# Patient Record
Sex: Male | Born: 1940 | Race: White | Hispanic: No | Marital: Married | Smoking: Former smoker
Health system: Southern US, Community
[De-identification: ages and names within clinical notes are randomized; demographics above are authoritative.]

## PROBLEM LIST (undated history)

## (undated) DIAGNOSIS — Z8719 Personal history of other diseases of the digestive system: Secondary | ICD-10-CM

## (undated) DIAGNOSIS — Z87442 Personal history of urinary calculi: Secondary | ICD-10-CM

## (undated) DIAGNOSIS — I2510 Atherosclerotic heart disease of native coronary artery without angina pectoris: Secondary | ICD-10-CM

## (undated) DIAGNOSIS — C801 Malignant (primary) neoplasm, unspecified: Secondary | ICD-10-CM

## (undated) DIAGNOSIS — R0602 Shortness of breath: Secondary | ICD-10-CM

## (undated) DIAGNOSIS — K219 Gastro-esophageal reflux disease without esophagitis: Secondary | ICD-10-CM

## (undated) DIAGNOSIS — H6690 Otitis media, unspecified, unspecified ear: Secondary | ICD-10-CM

## (undated) DIAGNOSIS — E785 Hyperlipidemia, unspecified: Secondary | ICD-10-CM

## (undated) DIAGNOSIS — R9431 Abnormal electrocardiogram [ECG] [EKG]: Secondary | ICD-10-CM

## (undated) DIAGNOSIS — R600 Localized edema: Secondary | ICD-10-CM

## (undated) DIAGNOSIS — M1990 Unspecified osteoarthritis, unspecified site: Secondary | ICD-10-CM

## (undated) DIAGNOSIS — I10 Essential (primary) hypertension: Secondary | ICD-10-CM

## (undated) DIAGNOSIS — G629 Polyneuropathy, unspecified: Secondary | ICD-10-CM

## (undated) DIAGNOSIS — R7303 Prediabetes: Secondary | ICD-10-CM

## (undated) HISTORY — PX: TONSILLECTOMY: SUR1361

## (undated) HISTORY — PX: COLECTOMY: SHX59

## (undated) HISTORY — PX: PENILE PROSTHESIS IMPLANT: SHX240

## (undated) HISTORY — PX: LUMBAR SPINE SURGERY: SHX701

## (undated) HISTORY — PX: APPENDECTOMY: SHX54

---

## 1999-12-01 ENCOUNTER — Ambulatory Visit (HOSPITAL_COMMUNITY): Admission: RE | Admit: 1999-12-01 | Discharge: 1999-12-01 | Payer: Self-pay | Admitting: *Deleted

## 1999-12-01 ENCOUNTER — Encounter (INDEPENDENT_AMBULATORY_CARE_PROVIDER_SITE_OTHER): Payer: Self-pay | Admitting: *Deleted

## 1999-12-08 ENCOUNTER — Other Ambulatory Visit: Admission: RE | Admit: 1999-12-08 | Discharge: 1999-12-08 | Payer: Self-pay | Admitting: Urology

## 2000-01-16 ENCOUNTER — Inpatient Hospital Stay (HOSPITAL_COMMUNITY): Admission: RE | Admit: 2000-01-16 | Discharge: 2000-01-19 | Payer: Self-pay | Admitting: Urology

## 2000-01-16 ENCOUNTER — Encounter (INDEPENDENT_AMBULATORY_CARE_PROVIDER_SITE_OTHER): Payer: Self-pay

## 2000-09-21 ENCOUNTER — Encounter: Admission: RE | Admit: 2000-09-21 | Discharge: 2000-09-21 | Payer: Self-pay | Admitting: Internal Medicine

## 2000-09-21 ENCOUNTER — Encounter: Payer: Self-pay | Admitting: Internal Medicine

## 2001-09-25 HISTORY — PX: PROSTATECTOMY: SHX69

## 2001-10-11 ENCOUNTER — Encounter: Admission: RE | Admit: 2001-10-11 | Discharge: 2001-10-11 | Payer: Self-pay | Admitting: *Deleted

## 2001-11-27 ENCOUNTER — Ambulatory Visit (HOSPITAL_BASED_OUTPATIENT_CLINIC_OR_DEPARTMENT_OTHER): Admission: RE | Admit: 2001-11-27 | Discharge: 2001-11-27 | Payer: Self-pay | Admitting: Urology

## 2002-09-26 ENCOUNTER — Encounter: Payer: Self-pay | Admitting: Neurosurgery

## 2002-09-26 ENCOUNTER — Ambulatory Visit (HOSPITAL_COMMUNITY): Admission: RE | Admit: 2002-09-26 | Discharge: 2002-09-26 | Payer: Self-pay | Admitting: Neurosurgery

## 2004-03-17 ENCOUNTER — Encounter: Admission: RE | Admit: 2004-03-17 | Discharge: 2004-03-17 | Payer: Self-pay | Admitting: Internal Medicine

## 2004-07-28 ENCOUNTER — Ambulatory Visit (HOSPITAL_COMMUNITY): Admission: RE | Admit: 2004-07-28 | Discharge: 2004-07-28 | Payer: Self-pay | Admitting: Neurosurgery

## 2004-09-21 ENCOUNTER — Encounter: Admission: RE | Admit: 2004-09-21 | Discharge: 2004-09-21 | Payer: Self-pay | Admitting: Internal Medicine

## 2005-03-01 ENCOUNTER — Encounter: Admission: RE | Admit: 2005-03-01 | Discharge: 2005-03-01 | Payer: Self-pay | Admitting: Neurosurgery

## 2007-11-29 ENCOUNTER — Ambulatory Visit: Payer: Self-pay | Admitting: Cardiovascular Disease

## 2007-12-12 ENCOUNTER — Encounter: Payer: Self-pay | Admitting: Cardiovascular Disease

## 2007-12-12 ENCOUNTER — Ambulatory Visit: Payer: Self-pay

## 2011-02-07 NOTE — Assessment & Plan Note (Signed)
[facility]                            CARDIOLOGY OFFICE NOTE   EDUARD, PENKALA                      MRN:          [id number]  DATE:11/29/2007                            DOB:          [birth year]    Mr. Lesko is a somewhat cantankerous 70 year old patient referred by  Dr. Constance Goltz for hypertension, dyspnea, hypercholesterolemia, abnormal  EKG, question old MI.  The patient is an interesting individual.  He  really does not like taking medications.  He does not like following  physicians' advice as far as I can tell, except for the fact that he  thinks a yearly physical is important.  He probably has hypertension and  hypercholesterolemia.  He has not been on regular medications.  He was  prescribed Cardizem for his blood pressure, took 1 pill and felt that it  caused chest pain.  He is not looking forward to taking it again.  He  says he has had 3 stress tests in the past, most recent was probably 3  to 5 years ago.  His EKG shows left axis deviation and he has been told  in the past he had an old MI.  There has been no episodes of chest pain  or hospitalizations to suggest this.  As far as I can tell, talking to  the patient, an old MI has never been documented by echo or stress  testing.   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   The patient's risk factors are significant.  He is overweight.  He is a  smoker.  He has hyperlipidemia, borderline resting high blood pressure  which is undoubtedly high with exercise.   He is non-diabetic.   FAMILY HISTORY:  Negative.   The patient has difficulty exercising.  He has some pins in his hip and  is overweight.   Outside of that isolated episode of chest pain that he had with his  Cardizem, he is able to exercise.  His primary symptom is dyspnea.  It  is likely due to deconditioning,  being overweight, and his smoking.  He  has not had formal PFTs as far as I can tell.  He did have a cold a  couple weeks ago and was wheezy.  He did not have inhalers.  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  There has been no active fever or  coughing and his URI seems to have subsided.   His review of systems is otherwise negative.   PAST MEDICAL HISTORY:  Remarkable for back problems, previous  prostatectomy, previous colostomy with reversal, carpal tunnel syndrome,  hypertension, hypercholesterolemia.   MEDICATIONS:  1. Cardizem 240 a day currently not taking.  2. Fish oil.  3. Melatonin 5 mg a day.  4. Ginkgo biloba 120 mg a day.  5. B6.  6. Aspirin.  7. Anti-inflammatories.   The patient is married with one child.  He is retired from the [employer], chartered  business.  He is sedentary.  He smokes a pack a day.  He drinks on occasion.  He has a [place], which she allows his wife to  walk and is fairly sedentary.   EXAM:  Remarkable for an overweight white male in no distress.  Blood pressure is 140/80, pulse 80 regular, respiratory 14, weight is  247.  HEENT:  Unremarkable.  Carotids are without bruit, no lymphadenopathy, thyromegaly, JVP  elevation.  LUNGS:  Clear with good diaphragmatic motion.  No wheezing.  S1-S2 with distant heart sounds.  PMI not palpable.  ABDOMEN:  Benign.  Bowel sounds positive.  No AAA, no tenderness.  No  hepatosplenomegaly or hepatojugular reflux.  No bruit.  Distal pulses intact.  Trace edema.  NEURO:  Nonfocal.  SKIN:  Warm and dry.  No muscle weakness.   EKG was as indicated with left axis deviation from fascicular block,  probably no old MI.   IMPRESSION:  1. Question old myocardial infarction.  Doubtful.  The patient will be      referred for a stress echo.  He can probably walk on a treadmill.  2. Hypertension.  I gave him a piece of paper with lisinopril on it.      I think this would be the best medication for  him.  Apparently he      has felt fatigued on beta blockers before.  Continue low-salt diet      and weight loss.  3. He will have his cholesterol checked at Dr. Lonell Face office.      Again I talked him about possibly being on a statin drug, but he      does not seem enthused about this.  4. Dyspnea functional.  Spoke him for less than 10 minutes regarding      counseling for a smoking cessation.  He would be a good candidate      for Wellbutrin.  The patient should have baseline PFTs pre and post      bronchodilator.  He should also get a Pneumovax and influenza shot      in the wintertime.  5. Left hip pain.  Continue anti-inflammatories p.r.n.   I will see the patient back if his stress echo is abnormal.     Theron Arista C. Eden Emms, MD, [facility]  Electronically Signed    PCN/MedQ  DD: 11/29/2007  DT: 11/29/2007  Job #: [id number]   cc:   Kendrick Ranch, M.D.

## 2011-02-10 NOTE — H&P (Signed)
[facility]  Patient:    Bradley Spencer, Bradley Spencer                      MRN: [id number] Adm. Date:  [id number] Disc. Date: [id number] Attending:  Thermon Leyland                         History and Physical  ADMISSION DIAGNOSES:  Stage T1C adenocarcinoma of the prostate.  HISTORY OF PRESENT ILLNESS:  Bradley Spencer is a 70 year old male.  He has been recently diagnosed with biopsy-proven adenocarcinoma of the prostate.  His PSA as been in the 3.5-4.0 range.  The patient had Gleason 6 adenocarcinoma of the prostate involving approximately 10% of the biopsy in the right lobe of the prostate.  He is to undergo a radical retropubic prostatectomy today and will be admitted for routine postoperative care.  He has no significant voiding symptoms. He has no systemic complaints such as weight loss, bony pain, pelvic discomfort, or any significant voiding symptoms.  PAST MEDICAL HISTORY:  Is relatively unremarkable.  CURRENT MEDICATIONS:  None regularly.  ALLERGIES:  No known drug allergies.  SOCIAL HISTORY:  He does have approximately a 50-60-pack-year smoking history and he is a social drinker.  REVIEW OF SYSTEMS:  Negative for weight loss, bony pain, fever, chills, dysuria, gross hematuria, etc.  FAMILY HISTORY:  Noncontributory.  PHYSICAL EXAMINATION:  GENERAL:  He is a well-developed, well-nourished male.  He is afebrile.  VITAL SIGNS:  Blood pressure 126/94, pulse 76.  NECK:  Without jugular venous distention.  CHEST:  Clear to auscultation.  ABDOMEN:  Soft, nontender.  GENITOURINARY:  External genitalia within normal limits.  RECTAL:  Prostate was 1+ in size with no nodularity or induration.  EXTREMITIES:  Without edema.  LABORATORY DATA:  Preoperative hemoglobin 16.6.  His basic metabolic panel was within normal limits.  ASSESSMENT:  Clinical stage T1C adenocarcinoma of the prostate.  PLAN:  The patient will undergo a radical retropubic  prostatectomy with possible lymph node dissection today, and will be admitted for routine postoperative care. DD:  02/02/00 TD:  02/02/00 Job: [id number]

## 2011-02-10 NOTE — Procedures (Signed)
[place]  Patient:    Bradley Spencer, Bradley Spencer                      MRN: [id number] Proc. Date: 12/01/99 Adm. Date:  [id number] Attending:  Sabino Gasser                           Procedure Report  PROCEDURE PERFORMED:  Colonoscopy.  ENDOSCOPIST:  Sabino Gasser, M.D.  INDICATIONS FOR PROCEDURE:  Hemoccult positivity.  ANESTHESIA:  Demerol 100 mg, Versed 8 mg was given intravenously in divided dose.  DESCRIPTION OF PROCEDURE:  With the patient mildly sedated in the left lateral decubitus position, the Olympus videoscopic colonoscope was inserted in the rectum and passed under direct vision into the cecum.  The cecum was identified by the  ileocecal valve and appendiceal orifice, both of which were photographed. There were right-sided diverticula seen and and photographed.  From this point, the colonoscope was deflected into the terminal ileum by the ileocecal valve.  This  appeared normal as well and was photographed.  The endoscope was then withdrawn, taking circumferential views of the entire colonic mucosa.  Diverticula were seen on the left side of the colon as well.  At approximately 20 cm from the anal verge, there was a polyp seen, photographed and using hot biopsy technique at setting 20/20 blended current, it was removed.  The endoscope was then pulled to the rectum,which appeared normal on direct and showed internal hemorrhoids on retroflex view.  The endoscope was straightened and withdrawn.  Patients vital signs and pulse oximeter remained stable.  The patient tolerated the procedure well and without apparent complications.  FINDINGS:  Diverticulosis, right and left side, mild to moderate, internal hemorrhoids and small polyp removed.  Await biopsy report.  Patient will call me for results and follow up with me as  needed. DD:  12/01/99 TD:  12/02/99 Job: [id number]

## 2011-02-10 NOTE — Op Note (Signed)
[facility]  Patient:    Bradley Spencer, Bradley Spencer                      MRN: [id number] Proc. Date: 01/16/00 Adm. Date:  [id number] Disc. Date: [id number] Attending:  Thermon Leyland                           Operative Report  DISCHARGE DIAGNOSIS:  Adenocarcinoma of the prostate, pathologic stage P, T2b.  PROCEDURE PERFORMED:  Pelvic lymph node dissection and radical retropubic prostatectomy on January 16, 2000.  HOSPITAL COURSE:  [name] is a 70 year old male.  He was diagnosed with biopsy-proven stage T1c adenocarcinoma of the prostate.  His PSA was in the 3.5 to 4.0 range.  He had a reduced PSA 2 reading.  Biopsies revealed a Gleason 6 adenocarcinoma of the prostrate.  The patient underwent extensive consulting with regarding to treatment options and elected to have a pelvic lymph node dissection and radical retropubic prostatectomy.  He was admitted for routine postoperative care status post the procedure.  The patients operative case was relatively uneventful.  There was a higher than  average blood loss during the procedure but the patient remained stable and did not require transfusion.  His postoperative course was relatively uneventful.  The patient did develop a little bit of confusion on postoperative day #1.  He apparently accidentally removed his IV during the postoperative course and there was some question about possibly some tugging on his Foley catheter.  He was febrile intermittently for the first 48 hours consistent with atelectasis.  His  Jackson-Pratt drainage intermittently and was quite high and was as much as 250 to 300 cc in the eight hour period consistent with some urinary leakage into the Jackson-Pratt drain.  His hemoglobin on postoperative day #1 was 8.7 and this remained relatively stable.  Renal function remained normal.  The patient was discharged home on postoperative day #3.  At that time he was afebrile.  He  was  tolerating a p.o. diet well.  He was ambulating and his pain management was under good control.  His Jackson-Pratt drain continued to put out between 100 and 200 cc per eight-hour shift with the urinary output in the 700 to 1000 range.  We felt  this was consistent with ongoing leakage from the anastomosis and did not feel he Jackson-Pratt could be removed.  We offered to allow the patient to stay in the  hospital but he insists on discharge.  His incision was well-healed and his exam was otherwise unremarkable.  His final pathology revealed Gleason 6 adenocarcinoma with bilateral involvement but negative capsular extension and no evidence of positive margins.  Lymph nodes were negative.  DISPOSITION:  The patient was discharged to home.  He will be given a leg bag as well as a larger drainage bag.  His wife has been taught how to empty the Jackson-Pratt bulb and to record the output.  He will be sent home on Ditropan L for spasms as well as a one a day Cipro and some pain medication.  He will follow up in our office for staple removal and possible Jackson-Pratt removal in approximately one weeks time. DD:  01/19/00 TD:  01/19/00 Job: [id number]

## 2011-02-10 NOTE — Op Note (Signed)
[facility]  Patient:    Bradley Spencer, Bradley Spencer Visit Number: [id number] MRN: [id number]          Service Type: NES Location: [facility] Attending Physician:  Thermon Leyland Dictated by:   Barron Alvine, M.D. Proc. Date: 11/27/01 Admit Date:  11/27/2001                             Operative Report  PREOPERATIVE DIAGNOSES: 1. Impotence. 2. Peyronies disease.  POSTOPERATIVE DIAGNOSES: 1. Impotence. 2. Peyronies disease.  PROCEDURE PERFORMED:  Implantation of Mentor malleable penile prosthesis.  SURGEON:  Barron Alvine, M.D.  ANESTHESIA:  General.  INDICATIONS:  Bradley Spencer is a 70 year old male.  He is status post radical retropubic prostatectomy for adenocarcinoma of the prostate.  His PSAs have been undetectable, and there is no evidence of disease recurrence.  He had some preoperative sexual dysfunction which progressed status post procedure. He has also been evaluated and diagnosed with Peyronies disease.  The patients biggest complaint has been loss of penile length.  He was told that there can be some shortening of the penis with radical retropubic prostatectomy, and certainly Peyronies disease can result in considerable loss of elasticity of the penis with fibrosis.  He has had a variety of evaluations and treatments discussed and tried with the patient.  He has also received a second opinion at [facility].  The patient came in recently requesting a rigid/malleable prosthesis.  We discussed with him the distinct advantages of an inflatable three-piece prosthesis, especially given the combination of impotence and Peyronies disease.  He really preferred not to have that for a variety of reasons which are outlined in some of our office notes.  We again felt that the inflatable prosthesis was probably going to be a better option for him, but he was insistent on requesting the malleable prosthesis.  One of the reasons was that he wanted to have  things done strictly as an outpatient.  The other issues were partly due to the concern over potential for device failure with the inflatable prosthesis.  He initially wanted this under IV sedation with local, but we felt that it may be more uncomfortable for him and when we discussed things with him in the preoperative area and he discussed the advantages and disadvantages of the various approaches with anesthesia, he eventually decided to actually have a general anesthetic versus a spinal.  He understands the risks and benefits of this procedure, and full and informed consent was obtained.  TECHNIQUE AND FINDINGS:  The patient was brought to the operating room where he had successful induction of general anesthesia.  He was placed in a supine position.  He underwent a prolonged preoperative scrub.  After prepping and draping him in the usual manner, a standard penile scrotal incision was made in the median raphe.  The urethra was identified with a small-caliber catheter, and the corporal cylinders were identified.  The dartos fascia was sharply and bluntly dissected off, leaving the glistening corporal bodies bilaterally.  Stay sutures were placed in both sides, and corporotomies were performed, measuring approximately 2-2.5 cm in length.  Metzenbaum scissors were used to initiate the dissection, and then we used Brooks dilators to dilate the corpora from 8 to 13 mm both proximally and distally.  There was some difficulty dilating distally, presumably due to some of the corporal fibrosis, but the proximal dilation was quite easy and uneventful.  Size was measured at  between 18-19 cm, and we elected to decide on an 18.5 cm prosthesis.  Copious irrigation was used with antibiotic solution.  We cut the cylinders at the appropriate 18.5 cm point and used the standard rear tip piece.  We did have some difficulty getting the cylinders within the corpora, but both went in without any evidence  of buckling.  The distal aspect of the prosthesis went out to the mid glans, and there was no evidence of any SST deformity.  The penis did not bend downward as much as usual, and I thought that was probably due to significant corporal fibrosis dorsally because of his Peyronies disease.  Again, the length appeared to be appropriate, and this resulted in good rigidity of the penis.  There was no evidence of bulging, and we went ahead and closed the corporotomies with interrupted Vicryl suture.  We performed a penile block with some Marcaine and infiltrated the skin as well. We again used additional irrigation and then closed the penile scrotal wound in the standard manner using a couple layers of Vicryl suture.  The patient appeared to tolerate the procedure well, and there were no obvious complications. Dictated by:   Barron Alvine, M.D. Attending Physician:  Thermon Leyland DD:  11/27/01 TD:  11/27/01 Job: [id number]

## 2012-09-25 HISTORY — PX: HIP ARTHROPLASTY: SHX981

## 2013-09-25 HISTORY — PX: PERCUTANEOUS CORONARY STENT INTERVENTION (PCI-S): SHX6016

## 2016-11-27 HISTORY — PX: CARDIAC CATHETERIZATION: SHX172

## 2017-09-27 DIAGNOSIS — M25562 Pain in left knee: Secondary | ICD-10-CM | POA: Diagnosis not present

## 2017-11-14 DIAGNOSIS — R05 Cough: Secondary | ICD-10-CM | POA: Diagnosis not present

## 2017-11-14 DIAGNOSIS — R062 Wheezing: Secondary | ICD-10-CM | POA: Diagnosis not present

## 2017-11-14 DIAGNOSIS — J40 Bronchitis, not specified as acute or chronic: Secondary | ICD-10-CM | POA: Diagnosis not present

## 2017-11-23 DIAGNOSIS — M1711 Unilateral primary osteoarthritis, right knee: Secondary | ICD-10-CM | POA: Diagnosis not present

## 2017-11-23 DIAGNOSIS — M1712 Unilateral primary osteoarthritis, left knee: Secondary | ICD-10-CM | POA: Diagnosis not present

## 2017-11-28 DIAGNOSIS — G609 Hereditary and idiopathic neuropathy, unspecified: Secondary | ICD-10-CM | POA: Diagnosis not present

## 2017-11-28 DIAGNOSIS — M5416 Radiculopathy, lumbar region: Secondary | ICD-10-CM | POA: Diagnosis not present

## 2017-12-12 DIAGNOSIS — M1711 Unilateral primary osteoarthritis, right knee: Secondary | ICD-10-CM | POA: Diagnosis not present

## 2017-12-12 DIAGNOSIS — G894 Chronic pain syndrome: Secondary | ICD-10-CM | POA: Diagnosis not present

## 2017-12-12 DIAGNOSIS — M12559 Traumatic arthropathy, unspecified hip: Secondary | ICD-10-CM | POA: Diagnosis not present

## 2017-12-12 DIAGNOSIS — M47816 Spondylosis without myelopathy or radiculopathy, lumbar region: Secondary | ICD-10-CM | POA: Diagnosis not present

## 2018-01-03 DIAGNOSIS — M1712 Unilateral primary osteoarthritis, left knee: Secondary | ICD-10-CM | POA: Diagnosis not present

## 2018-01-03 DIAGNOSIS — M1711 Unilateral primary osteoarthritis, right knee: Secondary | ICD-10-CM | POA: Diagnosis not present

## 2018-01-04 DIAGNOSIS — I10 Essential (primary) hypertension: Secondary | ICD-10-CM | POA: Diagnosis not present

## 2018-01-04 DIAGNOSIS — J449 Chronic obstructive pulmonary disease, unspecified: Secondary | ICD-10-CM | POA: Diagnosis not present

## 2018-01-04 DIAGNOSIS — Z8546 Personal history of malignant neoplasm of prostate: Secondary | ICD-10-CM | POA: Diagnosis not present

## 2018-01-04 DIAGNOSIS — E785 Hyperlipidemia, unspecified: Secondary | ICD-10-CM | POA: Diagnosis not present

## 2018-01-04 DIAGNOSIS — I2510 Atherosclerotic heart disease of native coronary artery without angina pectoris: Secondary | ICD-10-CM | POA: Diagnosis not present

## 2018-01-04 DIAGNOSIS — G609 Hereditary and idiopathic neuropathy, unspecified: Secondary | ICD-10-CM | POA: Diagnosis not present

## 2018-01-04 DIAGNOSIS — R7303 Prediabetes: Secondary | ICD-10-CM | POA: Diagnosis not present

## 2018-01-10 DIAGNOSIS — M1712 Unilateral primary osteoarthritis, left knee: Secondary | ICD-10-CM | POA: Diagnosis not present

## 2018-01-10 DIAGNOSIS — M1711 Unilateral primary osteoarthritis, right knee: Secondary | ICD-10-CM | POA: Diagnosis not present

## 2018-01-18 DIAGNOSIS — M1711 Unilateral primary osteoarthritis, right knee: Secondary | ICD-10-CM | POA: Diagnosis not present

## 2018-01-18 DIAGNOSIS — M1712 Unilateral primary osteoarthritis, left knee: Secondary | ICD-10-CM | POA: Diagnosis not present

## 2018-02-07 DIAGNOSIS — S0190XA Unspecified open wound of unspecified part of head, initial encounter: Secondary | ICD-10-CM | POA: Diagnosis not present

## 2018-02-07 DIAGNOSIS — S12590A Other displaced fracture of sixth cervical vertebra, initial encounter for closed fracture: Secondary | ICD-10-CM | POA: Diagnosis not present

## 2018-02-07 DIAGNOSIS — S0001XA Abrasion of scalp, initial encounter: Secondary | ICD-10-CM | POA: Diagnosis not present

## 2018-02-07 DIAGNOSIS — S0990XA Unspecified injury of head, initial encounter: Secondary | ICD-10-CM | POA: Diagnosis not present

## 2018-02-07 DIAGNOSIS — S14109A Unspecified injury at unspecified level of cervical spinal cord, initial encounter: Secondary | ICD-10-CM | POA: Diagnosis not present

## 2018-02-07 DIAGNOSIS — S22010A Wedge compression fracture of first thoracic vertebra, initial encounter for closed fracture: Secondary | ICD-10-CM | POA: Diagnosis not present

## 2018-02-07 DIAGNOSIS — S22020A Wedge compression fracture of second thoracic vertebra, initial encounter for closed fracture: Secondary | ICD-10-CM | POA: Diagnosis not present

## 2018-02-07 DIAGNOSIS — S129XXA Fracture of neck, unspecified, initial encounter: Secondary | ICD-10-CM | POA: Diagnosis not present

## 2018-02-07 DIAGNOSIS — R51 Headache: Secondary | ICD-10-CM | POA: Diagnosis not present

## 2018-02-07 DIAGNOSIS — M542 Cervicalgia: Secondary | ICD-10-CM | POA: Diagnosis not present

## 2018-02-07 DIAGNOSIS — S199XXA Unspecified injury of neck, initial encounter: Secondary | ICD-10-CM | POA: Diagnosis not present

## 2018-02-07 DIAGNOSIS — S134XXA Sprain of ligaments of cervical spine, initial encounter: Secondary | ICD-10-CM | POA: Diagnosis not present

## 2018-02-07 DIAGNOSIS — S12690A Other displaced fracture of seventh cervical vertebra, initial encounter for closed fracture: Secondary | ICD-10-CM | POA: Diagnosis not present

## 2018-02-08 DIAGNOSIS — M1611 Unilateral primary osteoarthritis, right hip: Secondary | ICD-10-CM | POA: Diagnosis not present

## 2018-02-08 DIAGNOSIS — S22020A Wedge compression fracture of second thoracic vertebra, initial encounter for closed fracture: Secondary | ICD-10-CM | POA: Diagnosis not present

## 2018-02-08 DIAGNOSIS — M8580 Other specified disorders of bone density and structure, unspecified site: Secondary | ICD-10-CM | POA: Diagnosis not present

## 2018-02-08 DIAGNOSIS — M898X5 Other specified disorders of bone, thigh: Secondary | ICD-10-CM | POA: Diagnosis not present

## 2018-02-08 DIAGNOSIS — W19XXXA Unspecified fall, initial encounter: Secondary | ICD-10-CM | POA: Diagnosis not present

## 2018-02-08 DIAGNOSIS — M533 Sacrococcygeal disorders, not elsewhere classified: Secondary | ICD-10-CM | POA: Diagnosis not present

## 2018-02-08 DIAGNOSIS — S12690A Other displaced fracture of seventh cervical vertebra, initial encounter for closed fracture: Secondary | ICD-10-CM | POA: Diagnosis not present

## 2018-02-08 DIAGNOSIS — Z87891 Personal history of nicotine dependence: Secondary | ICD-10-CM | POA: Diagnosis not present

## 2018-02-08 DIAGNOSIS — M47816 Spondylosis without myelopathy or radiculopathy, lumbar region: Secondary | ICD-10-CM | POA: Diagnosis not present

## 2018-02-08 DIAGNOSIS — M4184 Other forms of scoliosis, thoracic region: Secondary | ICD-10-CM | POA: Diagnosis not present

## 2018-02-08 DIAGNOSIS — M2578 Osteophyte, vertebrae: Secondary | ICD-10-CM | POA: Diagnosis not present

## 2018-02-08 DIAGNOSIS — M542 Cervicalgia: Secondary | ICD-10-CM | POA: Diagnosis not present

## 2018-02-08 DIAGNOSIS — I10 Essential (primary) hypertension: Secondary | ICD-10-CM | POA: Diagnosis not present

## 2018-02-08 DIAGNOSIS — R918 Other nonspecific abnormal finding of lung field: Secondary | ICD-10-CM | POA: Diagnosis not present

## 2018-02-08 DIAGNOSIS — S12600A Unspecified displaced fracture of seventh cervical vertebra, initial encounter for closed fracture: Secondary | ICD-10-CM | POA: Diagnosis not present

## 2018-02-08 DIAGNOSIS — S12590A Other displaced fracture of sixth cervical vertebra, initial encounter for closed fracture: Secondary | ICD-10-CM | POA: Diagnosis not present

## 2018-02-08 DIAGNOSIS — J449 Chronic obstructive pulmonary disease, unspecified: Secondary | ICD-10-CM | POA: Diagnosis not present

## 2018-02-08 DIAGNOSIS — S22010A Wedge compression fracture of first thoracic vertebra, initial encounter for closed fracture: Secondary | ICD-10-CM | POA: Diagnosis not present

## 2018-02-08 DIAGNOSIS — S12500A Unspecified displaced fracture of sixth cervical vertebra, initial encounter for closed fracture: Secondary | ICD-10-CM | POA: Diagnosis not present

## 2018-02-08 DIAGNOSIS — M47819 Spondylosis without myelopathy or radiculopathy, site unspecified: Secondary | ICD-10-CM | POA: Diagnosis not present

## 2018-02-08 DIAGNOSIS — S233XXA Sprain of ligaments of thoracic spine, initial encounter: Secondary | ICD-10-CM | POA: Diagnosis not present

## 2018-02-08 DIAGNOSIS — S0003XA Contusion of scalp, initial encounter: Secondary | ICD-10-CM | POA: Diagnosis not present

## 2018-02-08 DIAGNOSIS — S134XXA Sprain of ligaments of cervical spine, initial encounter: Secondary | ICD-10-CM | POA: Diagnosis not present

## 2018-02-08 DIAGNOSIS — K439 Ventral hernia without obstruction or gangrene: Secondary | ICD-10-CM | POA: Diagnosis not present

## 2018-02-08 DIAGNOSIS — S22028A Other fracture of second thoracic vertebra, initial encounter for closed fracture: Secondary | ICD-10-CM | POA: Diagnosis not present

## 2018-02-08 DIAGNOSIS — S22018A Other fracture of first thoracic vertebra, initial encounter for closed fracture: Secondary | ICD-10-CM | POA: Diagnosis not present

## 2018-02-08 DIAGNOSIS — S161XXA Strain of muscle, fascia and tendon at neck level, initial encounter: Secondary | ICD-10-CM | POA: Diagnosis not present

## 2018-02-08 DIAGNOSIS — J343 Hypertrophy of nasal turbinates: Secondary | ICD-10-CM | POA: Diagnosis not present

## 2018-02-14 ENCOUNTER — Other Ambulatory Visit: Payer: Self-pay

## 2018-02-14 DIAGNOSIS — R269 Unspecified abnormalities of gait and mobility: Secondary | ICD-10-CM | POA: Diagnosis not present

## 2018-02-14 DIAGNOSIS — I10 Essential (primary) hypertension: Secondary | ICD-10-CM | POA: Diagnosis not present

## 2018-02-14 DIAGNOSIS — S14109D Unspecified injury at unspecified level of cervical spinal cord, subsequent encounter: Secondary | ICD-10-CM | POA: Diagnosis not present

## 2018-02-14 DIAGNOSIS — M170 Bilateral primary osteoarthritis of knee: Secondary | ICD-10-CM | POA: Diagnosis not present

## 2018-02-14 NOTE — Patient Outreach (Addendum)
[facility]) Care Management  02/14/2018  EAVEN SCHWAGER 19-Feb-1941 [id number]     Transition of Care Referral  Referral Date: 02/14/18 Referral Source: [facility] Discharge Report Date of Admission: unknown Diagnosis: unknown Date of Discharge: 02/08/18 Facility: [facility] Insurance: HTA    Outreach attempt # 1 to patient. Spoke with patient. Patient able to verify DOB. He voiced that address on file was not correct but would not provide address. Patient immediately began to become loud and rude on the phone voicing "this whole thing is ridiculous" and "totally screwed up." RN CM tried to inquire more and patient became more agitated. He mentioned something about his "brace too small" but became annoyed when probed for more info.RN CM tried to inquire more and patient became more agitated and said "y'all should know by now what's wrong" then he went on to voice that he is "done with [facility] as it totally sucks." Patient then proceeded to end the call.    Plan: RN CM will close case at this time as unable to engage patient.    Enzo Montgomery, RN,BSN,CCM [place] [facility] Telephonic Care Management Coordinator Direct Phone: [phone] Toll Free: [phone] Fax: [phone]

## 2018-02-16 DIAGNOSIS — I10 Essential (primary) hypertension: Secondary | ICD-10-CM | POA: Diagnosis not present

## 2018-02-16 DIAGNOSIS — R269 Unspecified abnormalities of gait and mobility: Secondary | ICD-10-CM | POA: Diagnosis not present

## 2018-02-16 DIAGNOSIS — S14109D Unspecified injury at unspecified level of cervical spinal cord, subsequent encounter: Secondary | ICD-10-CM | POA: Diagnosis not present

## 2018-02-16 DIAGNOSIS — M170 Bilateral primary osteoarthritis of knee: Secondary | ICD-10-CM | POA: Diagnosis not present

## 2018-02-23 DIAGNOSIS — I10 Essential (primary) hypertension: Secondary | ICD-10-CM | POA: Diagnosis not present

## 2018-02-23 DIAGNOSIS — M170 Bilateral primary osteoarthritis of knee: Secondary | ICD-10-CM | POA: Diagnosis not present

## 2018-02-23 DIAGNOSIS — R269 Unspecified abnormalities of gait and mobility: Secondary | ICD-10-CM | POA: Diagnosis not present

## 2018-02-23 DIAGNOSIS — S14109D Unspecified injury at unspecified level of cervical spinal cord, subsequent encounter: Secondary | ICD-10-CM | POA: Diagnosis not present

## 2018-03-01 DIAGNOSIS — S12591D Other nondisplaced fracture of sixth cervical vertebra, subsequent encounter for fracture with routine healing: Secondary | ICD-10-CM | POA: Diagnosis not present

## 2018-03-01 DIAGNOSIS — S22010D Wedge compression fracture of first thoracic vertebra, subsequent encounter for fracture with routine healing: Secondary | ICD-10-CM | POA: Diagnosis not present

## 2018-03-01 DIAGNOSIS — S22020D Wedge compression fracture of second thoracic vertebra, subsequent encounter for fracture with routine healing: Secondary | ICD-10-CM | POA: Diagnosis not present

## 2018-03-02 DIAGNOSIS — I10 Essential (primary) hypertension: Secondary | ICD-10-CM | POA: Diagnosis not present

## 2018-03-02 DIAGNOSIS — S14109D Unspecified injury at unspecified level of cervical spinal cord, subsequent encounter: Secondary | ICD-10-CM | POA: Diagnosis not present

## 2018-03-02 DIAGNOSIS — M170 Bilateral primary osteoarthritis of knee: Secondary | ICD-10-CM | POA: Diagnosis not present

## 2018-03-02 DIAGNOSIS — R269 Unspecified abnormalities of gait and mobility: Secondary | ICD-10-CM | POA: Diagnosis not present

## 2018-03-16 DIAGNOSIS — S14109D Unspecified injury at unspecified level of cervical spinal cord, subsequent encounter: Secondary | ICD-10-CM | POA: Diagnosis not present

## 2018-03-16 DIAGNOSIS — R269 Unspecified abnormalities of gait and mobility: Secondary | ICD-10-CM | POA: Diagnosis not present

## 2018-03-16 DIAGNOSIS — M170 Bilateral primary osteoarthritis of knee: Secondary | ICD-10-CM | POA: Diagnosis not present

## 2018-03-16 DIAGNOSIS — I10 Essential (primary) hypertension: Secondary | ICD-10-CM | POA: Diagnosis not present

## 2018-03-27 DIAGNOSIS — S22020A Wedge compression fracture of second thoracic vertebra, initial encounter for closed fracture: Secondary | ICD-10-CM | POA: Diagnosis not present

## 2018-03-27 DIAGNOSIS — S129XXD Fracture of neck, unspecified, subsequent encounter: Secondary | ICD-10-CM | POA: Diagnosis not present

## 2018-03-27 DIAGNOSIS — S22010A Wedge compression fracture of first thoracic vertebra, initial encounter for closed fracture: Secondary | ICD-10-CM | POA: Diagnosis not present

## 2018-03-27 DIAGNOSIS — S134XXD Sprain of ligaments of cervical spine, subsequent encounter: Secondary | ICD-10-CM | POA: Diagnosis not present

## 2018-04-08 DIAGNOSIS — S134XXD Sprain of ligaments of cervical spine, subsequent encounter: Secondary | ICD-10-CM | POA: Diagnosis not present

## 2018-04-08 DIAGNOSIS — M47812 Spondylosis without myelopathy or radiculopathy, cervical region: Secondary | ICD-10-CM | POA: Diagnosis not present

## 2018-04-08 DIAGNOSIS — S129XXD Fracture of neck, unspecified, subsequent encounter: Secondary | ICD-10-CM | POA: Diagnosis not present

## 2018-04-08 DIAGNOSIS — M4802 Spinal stenosis, cervical region: Secondary | ICD-10-CM | POA: Diagnosis not present

## 2018-04-08 DIAGNOSIS — S22010D Wedge compression fracture of first thoracic vertebra, subsequent encounter for fracture with routine healing: Secondary | ICD-10-CM | POA: Diagnosis not present

## 2018-04-08 DIAGNOSIS — S12600D Unspecified displaced fracture of seventh cervical vertebra, subsequent encounter for fracture with routine healing: Secondary | ICD-10-CM | POA: Diagnosis not present

## 2018-04-08 DIAGNOSIS — S22020D Wedge compression fracture of second thoracic vertebra, subsequent encounter for fracture with routine healing: Secondary | ICD-10-CM | POA: Diagnosis not present

## 2018-04-08 DIAGNOSIS — S12500D Unspecified displaced fracture of sixth cervical vertebra, subsequent encounter for fracture with routine healing: Secondary | ICD-10-CM | POA: Diagnosis not present

## 2018-04-10 DIAGNOSIS — M4802 Spinal stenosis, cervical region: Secondary | ICD-10-CM | POA: Diagnosis not present

## 2018-04-11 DIAGNOSIS — M1712 Unilateral primary osteoarthritis, left knee: Secondary | ICD-10-CM | POA: Diagnosis not present

## 2018-04-11 DIAGNOSIS — M1711 Unilateral primary osteoarthritis, right knee: Secondary | ICD-10-CM | POA: Diagnosis not present

## 2018-04-15 DIAGNOSIS — K210 Gastro-esophageal reflux disease with esophagitis: Secondary | ICD-10-CM | POA: Diagnosis not present

## 2018-04-15 DIAGNOSIS — I454 Nonspecific intraventricular block: Secondary | ICD-10-CM | POA: Diagnosis not present

## 2018-04-15 DIAGNOSIS — H6123 Impacted cerumen, bilateral: Secondary | ICD-10-CM | POA: Diagnosis not present

## 2018-04-15 DIAGNOSIS — Z01810 Encounter for preprocedural cardiovascular examination: Secondary | ICD-10-CM | POA: Diagnosis not present

## 2018-04-15 DIAGNOSIS — R9431 Abnormal electrocardiogram [ECG] [EKG]: Secondary | ICD-10-CM | POA: Diagnosis not present

## 2018-04-25 DIAGNOSIS — M5001 Cervical disc disorder with myelopathy,  high cervical region: Secondary | ICD-10-CM | POA: Diagnosis not present

## 2018-04-25 DIAGNOSIS — M4802 Spinal stenosis, cervical region: Secondary | ICD-10-CM | POA: Diagnosis not present

## 2018-04-25 DIAGNOSIS — G959 Disease of spinal cord, unspecified: Secondary | ICD-10-CM | POA: Diagnosis not present

## 2018-04-25 DIAGNOSIS — M4712 Other spondylosis with myelopathy, cervical region: Secondary | ICD-10-CM | POA: Diagnosis not present

## 2018-04-25 DIAGNOSIS — G729 Myopathy, unspecified: Secondary | ICD-10-CM | POA: Diagnosis not present

## 2018-04-25 HISTORY — PX: CERVICAL DISC SURGERY: SHX588

## 2018-04-26 DIAGNOSIS — S12600A Unspecified displaced fracture of seventh cervical vertebra, initial encounter for closed fracture: Secondary | ICD-10-CM | POA: Diagnosis not present

## 2018-04-26 DIAGNOSIS — Z981 Arthrodesis status: Secondary | ICD-10-CM | POA: Diagnosis not present

## 2018-04-26 DIAGNOSIS — S12500A Unspecified displaced fracture of sixth cervical vertebra, initial encounter for closed fracture: Secondary | ICD-10-CM | POA: Diagnosis not present

## 2018-05-24 DIAGNOSIS — M5136 Other intervertebral disc degeneration, lumbar region: Secondary | ICD-10-CM | POA: Diagnosis not present

## 2018-05-28 ENCOUNTER — Other Ambulatory Visit (HOSPITAL_COMMUNITY): Payer: Self-pay | Admitting: Orthopedic Surgery

## 2018-05-28 DIAGNOSIS — M545 Low back pain: Secondary | ICD-10-CM

## 2018-06-12 DIAGNOSIS — Z981 Arthrodesis status: Secondary | ICD-10-CM | POA: Diagnosis not present

## 2018-06-12 DIAGNOSIS — M4802 Spinal stenosis, cervical region: Secondary | ICD-10-CM | POA: Diagnosis not present

## 2018-06-14 DIAGNOSIS — J449 Chronic obstructive pulmonary disease, unspecified: Secondary | ICD-10-CM | POA: Diagnosis not present

## 2018-06-14 DIAGNOSIS — Z23 Encounter for immunization: Secondary | ICD-10-CM | POA: Diagnosis not present

## 2018-06-14 DIAGNOSIS — Z981 Arthrodesis status: Secondary | ICD-10-CM | POA: Diagnosis not present

## 2018-06-14 DIAGNOSIS — I10 Essential (primary) hypertension: Secondary | ICD-10-CM | POA: Diagnosis not present

## 2018-06-25 ENCOUNTER — Ambulatory Visit: Admit: 2018-06-25 | Payer: PPO

## 2018-06-25 ENCOUNTER — Ambulatory Visit (HOSPITAL_COMMUNITY): Payer: PPO

## 2018-06-25 SURGERY — MRI WITH ANESTHESIA
Anesthesia: General

## 2018-07-11 DIAGNOSIS — M1711 Unilateral primary osteoarthritis, right knee: Secondary | ICD-10-CM | POA: Diagnosis not present

## 2018-07-11 DIAGNOSIS — M1712 Unilateral primary osteoarthritis, left knee: Secondary | ICD-10-CM | POA: Diagnosis not present

## 2018-07-18 DIAGNOSIS — J449 Chronic obstructive pulmonary disease, unspecified: Secondary | ICD-10-CM | POA: Diagnosis not present

## 2018-07-18 DIAGNOSIS — Z01818 Encounter for other preprocedural examination: Secondary | ICD-10-CM | POA: Diagnosis not present

## 2018-07-18 DIAGNOSIS — E785 Hyperlipidemia, unspecified: Secondary | ICD-10-CM | POA: Diagnosis not present

## 2018-07-18 DIAGNOSIS — I2510 Atherosclerotic heart disease of native coronary artery without angina pectoris: Secondary | ICD-10-CM | POA: Diagnosis not present

## 2018-07-18 DIAGNOSIS — R7303 Prediabetes: Secondary | ICD-10-CM | POA: Diagnosis not present

## 2018-07-18 DIAGNOSIS — I10 Essential (primary) hypertension: Secondary | ICD-10-CM | POA: Diagnosis not present

## 2018-07-30 DIAGNOSIS — I10 Essential (primary) hypertension: Secondary | ICD-10-CM | POA: Diagnosis not present

## 2018-07-30 DIAGNOSIS — I252 Old myocardial infarction: Secondary | ICD-10-CM | POA: Diagnosis not present

## 2018-07-30 DIAGNOSIS — I2510 Atherosclerotic heart disease of native coronary artery without angina pectoris: Secondary | ICD-10-CM | POA: Diagnosis not present

## 2018-07-30 DIAGNOSIS — E7800 Pure hypercholesterolemia, unspecified: Secondary | ICD-10-CM | POA: Diagnosis not present

## 2018-07-30 DIAGNOSIS — Z01818 Encounter for other preprocedural examination: Secondary | ICD-10-CM | POA: Diagnosis not present

## 2018-08-01 DIAGNOSIS — M5416 Radiculopathy, lumbar region: Secondary | ICD-10-CM | POA: Diagnosis not present

## 2018-08-01 DIAGNOSIS — M47816 Spondylosis without myelopathy or radiculopathy, lumbar region: Secondary | ICD-10-CM | POA: Diagnosis not present

## 2018-08-01 DIAGNOSIS — G609 Hereditary and idiopathic neuropathy, unspecified: Secondary | ICD-10-CM | POA: Diagnosis not present

## 2018-08-01 DIAGNOSIS — M170 Bilateral primary osteoarthritis of knee: Secondary | ICD-10-CM | POA: Diagnosis not present

## 2018-08-01 DIAGNOSIS — G894 Chronic pain syndrome: Secondary | ICD-10-CM | POA: Diagnosis not present

## 2018-08-15 DIAGNOSIS — R0989 Other specified symptoms and signs involving the circulatory and respiratory systems: Secondary | ICD-10-CM | POA: Diagnosis not present

## 2018-08-15 DIAGNOSIS — R230 Cyanosis: Secondary | ICD-10-CM | POA: Diagnosis not present

## 2018-08-16 DIAGNOSIS — R0989 Other specified symptoms and signs involving the circulatory and respiratory systems: Secondary | ICD-10-CM | POA: Diagnosis not present

## 2018-08-16 DIAGNOSIS — R230 Cyanosis: Secondary | ICD-10-CM | POA: Diagnosis not present

## 2018-08-16 DIAGNOSIS — I10 Essential (primary) hypertension: Secondary | ICD-10-CM | POA: Diagnosis not present

## 2018-08-16 DIAGNOSIS — Z7982 Long term (current) use of aspirin: Secondary | ICD-10-CM | POA: Diagnosis not present

## 2018-08-16 DIAGNOSIS — I252 Old myocardial infarction: Secondary | ICD-10-CM | POA: Diagnosis not present

## 2018-08-16 DIAGNOSIS — Z7902 Long term (current) use of antithrombotics/antiplatelets: Secondary | ICD-10-CM | POA: Diagnosis not present

## 2018-08-16 DIAGNOSIS — I2510 Atherosclerotic heart disease of native coronary artery without angina pectoris: Secondary | ICD-10-CM | POA: Diagnosis not present

## 2018-09-02 DIAGNOSIS — H52203 Unspecified astigmatism, bilateral: Secondary | ICD-10-CM | POA: Diagnosis not present

## 2018-09-02 DIAGNOSIS — H02834 Dermatochalasis of left upper eyelid: Secondary | ICD-10-CM | POA: Diagnosis not present

## 2018-09-02 DIAGNOSIS — H524 Presbyopia: Secondary | ICD-10-CM | POA: Diagnosis not present

## 2018-09-02 DIAGNOSIS — H2513 Age-related nuclear cataract, bilateral: Secondary | ICD-10-CM | POA: Diagnosis not present

## 2018-09-02 DIAGNOSIS — H43393 Other vitreous opacities, bilateral: Secondary | ICD-10-CM | POA: Diagnosis not present

## 2018-09-02 DIAGNOSIS — H5213 Myopia, bilateral: Secondary | ICD-10-CM | POA: Diagnosis not present

## 2018-09-02 DIAGNOSIS — H25013 Cortical age-related cataract, bilateral: Secondary | ICD-10-CM | POA: Diagnosis not present

## 2018-09-02 DIAGNOSIS — H02831 Dermatochalasis of right upper eyelid: Secondary | ICD-10-CM | POA: Diagnosis not present

## 2018-09-02 DIAGNOSIS — R7303 Prediabetes: Secondary | ICD-10-CM | POA: Diagnosis not present

## 2018-09-04 DIAGNOSIS — R0989 Other specified symptoms and signs involving the circulatory and respiratory systems: Secondary | ICD-10-CM | POA: Diagnosis not present

## 2018-09-04 DIAGNOSIS — I10 Essential (primary) hypertension: Secondary | ICD-10-CM | POA: Diagnosis not present

## 2018-09-04 DIAGNOSIS — R230 Cyanosis: Secondary | ICD-10-CM | POA: Diagnosis not present

## 2018-09-04 DIAGNOSIS — I252 Old myocardial infarction: Secondary | ICD-10-CM | POA: Diagnosis not present

## 2018-09-04 DIAGNOSIS — I2510 Atherosclerotic heart disease of native coronary artery without angina pectoris: Secondary | ICD-10-CM | POA: Diagnosis not present

## 2018-09-10 ENCOUNTER — Encounter (HOSPITAL_COMMUNITY): Payer: Self-pay | Admitting: Student

## 2018-09-10 NOTE — Patient Instructions (Addendum)
Bradley Spencer  09/10/2018   Your procedure is scheduled on: 09-16-18   Report to [facility] Main  Entrance    Report to admitting at 8:00AM    Call this number if you have problems the morning of surgery [phone]     Remember: Do not eat food or drink liquids :After Midnight. BRUSH YOUR TEETH MORNING OF SURGERY AND RINSE YOUR MOUTH OUT, NO CHEWING GUM CANDY OR MINTS.     Take these medicines the morning of surgery with A SIP OF WATER: Duloxetine                                 You may not have any metal on your body including hair pins and              piercings  Do not wear jewelry, make-up, lotions, powders or perfumes, deodorant                 Men may shave face and neck.   Do not bring valuables to the hospital. [place].  Contacts, dentures or bridgework may not be worn into surgery.  Leave suitcase in the car. After surgery it may be brought to your room.                  Please read over the following fact sheets you were given: _____________________________________________________________________             [facility] - Preparing for Surgery Before surgery, you can play an important role.  Because skin is not sterile, your skin needs to be as free of germs as possible.  You can reduce the number of germs on your skin by washing with CHG (chlorahexidine gluconate) soap before surgery.  CHG is an antiseptic cleaner which kills germs and bonds with the skin to continue killing germs even after washing. Please DO NOT use if you have an allergy to CHG or antibacterial soaps.  If your skin becomes reddened/irritated stop using the CHG and inform your nurse when you arrive at Short Stay. Do not shave (including legs and underarms) for at least 48 hours prior to the first CHG shower.  You may shave your face/neck. Please follow these instructions carefully:  1.  Shower with CHG Soap the  night before surgery and the  morning of Surgery.  2.  If you choose to wash your hair, wash your hair first as usual with your  normal  shampoo.  3.  After you shampoo, rinse your hair and body thoroughly to remove the  shampoo.                           4.  Use CHG as you would any other liquid soap.  You can apply chg directly  to the skin and wash                       Gently with a scrungie or clean washcloth.  5.  Apply the CHG Soap to your body ONLY FROM THE NECK DOWN.   Do not use on face/ open  Wound or open sores. Avoid contact with eyes, ears mouth and genitals (private parts).                       Wash face,  Genitals (private parts) with your normal soap.             6.  Wash thoroughly, paying special attention to the area where your surgery  will be performed.  7.  Thoroughly rinse your body with warm water from the neck down.  8.  DO NOT shower/wash with your normal soap after using and rinsing off  the CHG Soap.                9.  Pat yourself dry with a clean towel.            10.  Wear clean pajamas.            11.  Place clean sheets on your bed the night of your first shower and do not  sleep with pets. Day of Surgery : Do not apply any lotions/deodorants the morning of surgery.  Please wear clean clothes to the hospital/surgery center.  FAILURE TO FOLLOW THESE INSTRUCTIONS MAY RESULT IN THE CANCELLATION OF YOUR SURGERY PATIENT SIGNATURE_________________________________  NURSE SIGNATURE__________________________________  ________________________________________________________________________   Bradley Spencer  An incentive spirometer is a tool that can help keep your lungs clear and active. This tool measures how well you are filling your lungs with each breath. Taking long deep breaths may help reverse or decrease the chance of developing breathing (pulmonary) problems (especially infection) following:  A long period of time when you  are unable to move or be active. BEFORE THE PROCEDURE   If the spirometer includes an indicator to show your best effort, your nurse or respiratory therapist will set it to a desired goal.  If possible, sit up straight or lean slightly forward. Try not to slouch.  Hold the incentive spirometer in an upright position. INSTRUCTIONS FOR USE  1. Sit on the edge of your bed if possible, or sit up as far as you can in bed or on a chair. 2. Hold the incentive spirometer in an upright position. 3. Breathe out normally. 4. Place the mouthpiece in your mouth and seal your lips tightly around it. 5. Breathe in slowly and as deeply as possible, raising the piston or the ball toward the top of the column. 6. Hold your breath for 3-5 seconds or for as long as possible. Allow the piston or ball to fall to the bottom of the column. 7. Remove the mouthpiece from your mouth and breathe out normally. 8. Rest for a few seconds and repeat Steps 1 through 7 at least 10 times every 1-2 hours when you are awake. Take your time and take a few normal breaths between deep breaths. 9. The spirometer may include an indicator to show your best effort. Use the indicator as a goal to work toward during each repetition. 10. After each set of 10 deep breaths, practice coughing to be sure your lungs are clear. If you have an incision (the cut made at the time of surgery), support your incision when coughing by placing a pillow or rolled up towels firmly against it. Once you are able to get out of bed, walk around indoors and cough well. You may stop using the incentive spirometer when instructed by your caregiver.  RISKS AND COMPLICATIONS  Take your time so you do not get  dizzy or light-headed.  If you are in pain, you may need to take or ask for pain medication before doing incentive spirometry. It is harder to take a deep breath if you are having pain. AFTER USE  Rest and breathe slowly and easily.  It can be helpful to  keep track of a log of your progress. Your caregiver can provide you with a simple table to help with this. If you are using the spirometer at home, follow these instructions: [place] IF:   You are having difficultly using the spirometer.  You have trouble using the spirometer as often as instructed.  Your pain medication is not giving enough relief while using the spirometer.  You develop fever of 100.5 F (38.1 C) or higher. SEEK IMMEDIATE MEDICAL CARE IF:   You cough up bloody sputum that had not been present before.  You develop fever of 102 F (38.9 C) or greater.  You develop worsening pain at or near the incision site. MAKE SURE YOU:   Understand these instructions.  Will watch your condition.  Will get help right away if you are not doing well or get worse. Document Released: 01/22/2007 Document Revised: 12/04/2011 Document Reviewed: 03/25/2007 ExitCare Patient Information 2014 ExitCare, [place].   ________________________________________________________________________  WHAT IS A BLOOD TRANSFUSION? Blood Transfusion Information  A transfusion is the replacement of blood or some of its parts. Blood is made up of multiple cells which provide different functions.  Red blood cells carry oxygen and are used for blood loss replacement.  White blood cells fight against infection.  Platelets control bleeding.  Plasma helps clot blood.  Other blood products are available for specialized needs, such as hemophilia or other clotting disorders. BEFORE THE TRANSFUSION  Who gives blood for transfusions?   Healthy volunteers who are fully evaluated to make sure their blood is safe. This is blood bank blood. Transfusion therapy is the safest it has ever been in the practice of medicine. Before blood is taken from a donor, a complete history is taken to make sure that person has no history of diseases nor engages in risky social behavior (examples are intravenous drug  use or sexual activity with multiple partners). The donor's travel history is screened to minimize risk of transmitting infections, such as malaria. The donated blood is tested for signs of infectious diseases, such as HIV and hepatitis. The blood is then tested to be sure it is compatible with you in order to minimize the chance of a transfusion reaction. If you or a relative donates blood, this is often done in anticipation of surgery and is not appropriate for emergency situations. It takes many days to process the donated blood. RISKS AND COMPLICATIONS Although transfusion therapy is very safe and saves many lives, the main dangers of transfusion include:   Getting an infectious disease.  Developing a transfusion reaction. This is an allergic reaction to something in the blood you were given. Every precaution is taken to prevent this. The decision to have a blood transfusion has been considered carefully by your caregiver before blood is given. Blood is not given unless the benefits outweigh the risks. AFTER THE TRANSFUSION  Right after receiving a blood transfusion, you will usually feel much better and more energetic. This is especially true if your red blood cells have gotten low (anemic). The transfusion raises the level of the red blood cells which carry oxygen, and this usually causes an energy increase.  The nurse administering the transfusion will  monitor you carefully for complications. HOME CARE INSTRUCTIONS  No special instructions are needed after a transfusion. You may find your energy is better. Speak with your caregiver about any limitations on activity for underlying diseases you may have. SEEK MEDICAL CARE IF:   Your condition is not improving after your transfusion.  You develop redness or irritation at the intravenous (IV) site. SEEK IMMEDIATE MEDICAL CARE IF:  Any of the following symptoms occur over the next 12 hours:  Shaking chills.  You have a temperature by mouth  above 102 F (38.9 C), not controlled by medicine.  Chest, back, or muscle pain.  People around you feel you are not acting correctly or are confused.  Shortness of breath or difficulty breathing.  Dizziness and fainting.  You get a rash or develop hives.  You have a decrease in urine output.  Your urine turns a dark color or changes to pink, red, or brown. Any of the following symptoms occur over the next 10 days:  You have a temperature by mouth above 102 F (38.9 C), not controlled by medicine.  Shortness of breath.  Weakness after normal activity.  The white part of the eye turns yellow (jaundice).  You have a decrease in the amount of urine or are urinating less often.  Your urine turns a dark color or changes to pink, red, or brown. Document Released: 09/08/2000 Document Revised: 12/04/2011 Document Reviewed: 04/27/2008 [facility] Patient Information 2014 [place], [place].  _______________________________________________________________________

## 2018-09-10 NOTE — H&P (Signed)
TOTAL KNEE ADMISSION H&P  Patient is being admitted for right total knee arthroplasty.  Subjective:  Chief Complaint:right knee pain.  HPI: Bradley Spencer, 77 y.o. male, has a history of pain and functional disability in the right knee due to arthritis and has failed non-surgical conservative treatments for greater than 12 weeks to includecorticosteriod injections, viscosupplementation injections, use of assistive devices and activity modification.  Onset of symptoms was gradual, starting several years ago with gradually worsening course since that time. The patient noted no past surgery on the right knee(s).  Patient currently rates pain in the right knee(s) at 10 out of 10 with activity. Patient has worsening of pain with activity and weight bearing, crepitus and instability.  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 There is no active infection.  There are no active problems to display for this patient.  History reviewed. No pertinent past medical history.  History reviewed. No pertinent surgical history.  No current facility-administered medications for this encounter.    No current outpatient medications on file.   No Known Allergies  Social History   Tobacco Use  . Smoking status: Not on file  Substance Use Topics  . Alcohol use: Not on file    History reviewed. No pertinent family history.   Review of Systems  Constitutional: Negative for chills and fever.  HENT: Negative for congestion, sore throat and tinnitus.   Eyes: Negative for double vision, photophobia and pain.  Respiratory: Negative for cough, shortness of breath and wheezing.   Cardiovascular: Negative for chest pain, palpitations and orthopnea.  Gastrointestinal: Negative for heartburn, nausea and vomiting.  Genitourinary: Negative for dysuria, frequency and urgency.  Musculoskeletal: Positive for joint  pain.  Neurological: Negative for dizziness, weakness and headaches.    Objective:  Physical Exam  Well nourished and well developed. General: Alert and oriented x3, cooperative and pleasant, no acute distress. Head: normocephalic, atraumatic, neck supple. Eyes: EOMI. Respiratory: breath sounds clear in all fields, no wheezing, rales, or rhonchi. Cardiovascular: Regular rate and rhythm, no murmurs, gallops or rubs.  Abdomen: non-tender to palpation and soft, normoactive bowel sounds. Musculoskeletal: Right Knee Exam: No effusion. No Swelling.Range of motion is 0-135 degrees. Moderate crepitus on range of motion of the knee. Tenderness in both joint lines, medially greater than laterally. Stable knee. Calves soft and nontender. Motor function intact in LE. Strength 5/5 LE bilaterally. Neuro: Distal pulses 2+. Sensation to light touch intact in LE.  Vital signs in last 24 hours: Blood pressure: 134/86 mmHg Pulse: 80 bpm  Labs:   There is no height or weight on file to calculate BMI.   Imaging Review Plain radiographs demonstrate severe degenerative joint disease of the right knee(s). The overall alignment isneutral. The bone quality appears to be adequate for age and reported activity level.   Preoperative templating of the joint replacement has been completed, documented, and submitted to the Operating Room personnel in order to optimize intra-operative equipment management.   Anticipated LOS equal to or greater than 2 midnights due to - Age 75 and older with one or more of the following:  - Obesity  - Expected need for hospital services (PT, OT, Nursing) required for safe  discharge  - Anticipated need for postoperative skilled nursing care or inpatient rehab  - Active co-morbidities: Coronary Artery Disease OR   - Unanticipated findings during/Post Surgery: None  - Patient is a high risk of re-admission due to:  None     Assessment/Plan:  End stage arthritis, right  knee   The patient history, physical examination, clinical judgment of the provider and imaging studies are consistent with end stage degenerative joint disease of the right knee(s) and total knee arthroplasty is deemed medically necessary. The treatment options including medical management, injection therapy arthroscopy and arthroplasty were discussed at length. The risks and benefits of total knee arthroplasty were presented and reviewed. The risks due to aseptic loosening, infection, stiffness, patella tracking problems, thromboembolic complications and other imponderables were discussed. The patient acknowledged the explanation, agreed to proceed with the plan and consent was signed. Patient is being admitted for inpatient treatment for surgery, pain control, PT, OT, prophylactic antibiotics, VTE prophylaxis, progressive ambulation and ADL's and discharge planning. The patient is planning to be discharged home.   Therapy Plans: outpatient therapy at [facility]) Disposition: Home with wife Planned DVT Prophylaxis: Xarelto 10 mg daily (hx CAD (cardiac stent and prostate CA) DME needed: None PCP: Cathi Roan, PA-C Cardiologist: [place] TXA: IV Allergies: NKDA Anesthesia Concerns: None BMI: 37 Last HgbA1c: 5.9%  - Patient was instructed on what medications to stop prior to surgery. - Follow-up visit in 2 weeks with Dr. Wynelle Link - Begin physical therapy following surgery - Pre-operative lab work as pre-surgical testing - Prescriptions will be provided in hospital at time of discharge  Theresa Duty, PA-C Orthopedic Surgery [facility] [place]

## 2018-09-10 NOTE — Progress Notes (Signed)
Medical clearance received form [facility] has note by patient PCP mentioning that cardiac clearance is needed before surgery.  RN called and spoke with scheduler for Bradley Spencer, to request cardiac clearance. Per [name], she is currently waiting on the clearance.

## 2018-09-10 NOTE — Progress Notes (Signed)
EKG 07-18-18 on chart from [facility]   LOV / Cardiac Clearance Dr. Hildred Laser [place] 07-30-18 on chart

## 2018-09-11 ENCOUNTER — Encounter (HOSPITAL_COMMUNITY)
Admission: RE | Admit: 2018-09-11 | Discharge: 2018-09-11 | Disposition: A | Discharge status: Home / Self Care | Payer: PPO | Source: Ambulatory Visit | Attending: Orthopedic Surgery | Admitting: Orthopedic Surgery

## 2018-09-11 ENCOUNTER — Encounter (HOSPITAL_COMMUNITY): Payer: Self-pay

## 2018-09-11 ENCOUNTER — Other Ambulatory Visit: Payer: Self-pay

## 2018-09-11 DIAGNOSIS — Z01812 Encounter for preprocedural laboratory examination: Secondary | ICD-10-CM | POA: Diagnosis not present

## 2018-09-11 DIAGNOSIS — H60312 Diffuse otitis externa, left ear: Secondary | ICD-10-CM | POA: Diagnosis not present

## 2018-09-11 DIAGNOSIS — F1190 Opioid use, unspecified, uncomplicated: Secondary | ICD-10-CM | POA: Diagnosis not present

## 2018-09-11 DIAGNOSIS — H9312 Tinnitus, left ear: Secondary | ICD-10-CM | POA: Diagnosis not present

## 2018-09-11 DIAGNOSIS — F1490 Cocaine use, unspecified, uncomplicated: Secondary | ICD-10-CM | POA: Diagnosis not present

## 2018-09-11 DIAGNOSIS — H6121 Impacted cerumen, right ear: Secondary | ICD-10-CM | POA: Diagnosis not present

## 2018-09-11 DIAGNOSIS — F1290 Cannabis use, unspecified, uncomplicated: Secondary | ICD-10-CM | POA: Diagnosis not present

## 2018-09-11 DIAGNOSIS — H6690 Otitis media, unspecified, unspecified ear: Secondary | ICD-10-CM

## 2018-09-11 DIAGNOSIS — Z87891 Personal history of nicotine dependence: Secondary | ICD-10-CM | POA: Diagnosis not present

## 2018-09-11 DIAGNOSIS — Z7289 Other problems related to lifestyle: Secondary | ICD-10-CM | POA: Diagnosis not present

## 2018-09-11 HISTORY — DX: Hyperlipidemia, unspecified: E78.5

## 2018-09-11 HISTORY — DX: Gastro-esophageal reflux disease without esophagitis: K21.9

## 2018-09-11 HISTORY — DX: Otitis media, unspecified, unspecified ear: H66.90

## 2018-09-11 HISTORY — DX: Essential (primary) hypertension: I10

## 2018-09-11 HISTORY — DX: Shortness of breath: R06.02

## 2018-09-11 HISTORY — DX: Personal history of other diseases of the digestive system: Z87.19

## 2018-09-11 HISTORY — DX: Abnormal electrocardiogram (ECG) (EKG): R94.31

## 2018-09-11 HISTORY — DX: Unspecified osteoarthritis, unspecified site: M19.90

## 2018-09-11 HISTORY — DX: Prediabetes: R73.03

## 2018-09-11 HISTORY — DX: Polyneuropathy, unspecified: G62.9

## 2018-09-11 HISTORY — DX: Atherosclerotic heart disease of native coronary artery without angina pectoris: I25.10

## 2018-09-11 HISTORY — DX: Localized edema: R60.0

## 2018-09-11 HISTORY — DX: Personal history of urinary calculi: Z87.442

## 2018-09-11 HISTORY — DX: Malignant (primary) neoplasm, unspecified: C80.1

## 2018-09-11 LAB — CBC
HCT: 47.3 % (ref 39.0–52.0)
HEMOGLOBIN: 14.7 g/dL (ref 13.0–17.0)
MCH: 29.3 pg (ref 26.0–34.0)
MCHC: 31.1 g/dL (ref 30.0–36.0)
MCV: 94.4 fL (ref 80.0–100.0)
Platelets: 243 10*3/uL (ref 150–400)
RBC: 5.01 MIL/uL (ref 4.22–5.81)
RDW: 13.4 % (ref 11.5–15.5)
WBC: 9.3 10*3/uL (ref 4.0–10.5)
nRBC: 0 % (ref 0.0–0.2)

## 2018-09-11 LAB — COMPREHENSIVE METABOLIC PANEL
ALK PHOS: 99 U/L (ref 38–126)
ALT: 14 U/L (ref 0–44)
AST: 18 U/L (ref 15–41)
Albumin: 3.9 g/dL (ref 3.5–5.0)
Anion gap: 8 (ref 5–15)
BUN: 16 mg/dL (ref 8–23)
CO2: 27 mmol/L (ref 22–32)
Calcium: 9.5 mg/dL (ref 8.9–10.3)
Chloride: 105 mmol/L (ref 98–111)
Creatinine, Ser: 1.03 mg/dL (ref 0.61–1.24)
GFR calc Af Amer: >60 mL/min (ref >60)
GFR calc non Af Amer: >60 mL/min (ref >60)
Glucose, Bld: 143 mg/dL — ABNORMAL HIGH (ref 70–99)
Potassium: 4 mmol/L (ref 3.5–5.1)
Sodium: 140 mmol/L (ref 135–145)
Total Bilirubin: 0.9 mg/dL (ref 0.3–1.2)
Total Protein: 7.1 g/dL (ref 6.5–8.1)

## 2018-09-11 LAB — APTT: aPTT: 38 seconds — ABNORMAL HIGH (ref 24–36)

## 2018-09-11 LAB — SURGICAL PCR SCREEN
MRSA, PCR: NEGATIVE
Staphylococcus aureus: NEGATIVE

## 2018-09-11 LAB — PROTIME-INR
INR: 0.94
Prothrombin Time: 12.5 seconds (ref 11.4–15.2)

## 2018-09-11 LAB — ABO/RH: ABO/RH(D): AB POS

## 2018-09-15 MED ORDER — BUPIVACAINE LIPOSOME 1.3 % IJ SUSP
20.0000 mL | INTRAMUSCULAR | Status: DC
  Filled 2018-09-15: qty 20

## 2018-09-16 ENCOUNTER — Other Ambulatory Visit: Payer: Self-pay

## 2018-09-16 ENCOUNTER — Inpatient Hospital Stay (HOSPITAL_COMMUNITY): Payer: PPO | Admitting: Anesthesiology

## 2018-09-16 ENCOUNTER — Inpatient Hospital Stay (HOSPITAL_COMMUNITY)
Admission: RE | Admit: 2018-09-16 | Discharge: 2018-09-19 | DRG: 470 | Disposition: A | Discharge status: Home Health | Payer: PPO | Attending: Orthopedic Surgery | Admitting: Orthopedic Surgery

## 2018-09-16 ENCOUNTER — Encounter (HOSPITAL_COMMUNITY): Payer: Self-pay | Admitting: Certified Registered"

## 2018-09-16 ENCOUNTER — Encounter (HOSPITAL_COMMUNITY)
Admission: RE | Disposition: A | Discharge status: Home Health | Payer: Self-pay | Source: Home / Self Care | Attending: Orthopedic Surgery

## 2018-09-16 DIAGNOSIS — E669 Obesity, unspecified: Secondary | ICD-10-CM | POA: Diagnosis not present

## 2018-09-16 DIAGNOSIS — I440 Atrioventricular block, first degree: Secondary | ICD-10-CM | POA: Diagnosis not present

## 2018-09-16 DIAGNOSIS — Z87891 Personal history of nicotine dependence: Secondary | ICD-10-CM

## 2018-09-16 DIAGNOSIS — E785 Hyperlipidemia, unspecified: Secondary | ICD-10-CM | POA: Diagnosis present

## 2018-09-16 DIAGNOSIS — Z885 Allergy status to narcotic agent status: Secondary | ICD-10-CM

## 2018-09-16 DIAGNOSIS — M25761 Osteophyte, right knee: Secondary | ICD-10-CM | POA: Diagnosis present

## 2018-09-16 DIAGNOSIS — M1711 Unilateral primary osteoarthritis, right knee: Principal | ICD-10-CM | POA: Diagnosis present

## 2018-09-16 DIAGNOSIS — I2510 Atherosclerotic heart disease of native coronary artery without angina pectoris: Secondary | ICD-10-CM | POA: Diagnosis present

## 2018-09-16 DIAGNOSIS — G8918 Other acute postprocedural pain: Secondary | ICD-10-CM | POA: Diagnosis not present

## 2018-09-16 DIAGNOSIS — Z6837 Body mass index (BMI) 37.0-37.9, adult: Secondary | ICD-10-CM

## 2018-09-16 DIAGNOSIS — M179 Osteoarthritis of knee, unspecified: Secondary | ICD-10-CM | POA: Diagnosis present

## 2018-09-16 DIAGNOSIS — Z8546 Personal history of malignant neoplasm of prostate: Secondary | ICD-10-CM

## 2018-09-16 DIAGNOSIS — Z87442 Personal history of urinary calculi: Secondary | ICD-10-CM | POA: Diagnosis not present

## 2018-09-16 DIAGNOSIS — M1710 Unilateral primary osteoarthritis, unspecified knee: Secondary | ICD-10-CM | POA: Diagnosis present

## 2018-09-16 DIAGNOSIS — R7303 Prediabetes: Secondary | ICD-10-CM | POA: Diagnosis not present

## 2018-09-16 DIAGNOSIS — I10 Essential (primary) hypertension: Secondary | ICD-10-CM | POA: Diagnosis present

## 2018-09-16 DIAGNOSIS — K219 Gastro-esophageal reflux disease without esophagitis: Secondary | ICD-10-CM | POA: Diagnosis not present

## 2018-09-16 HISTORY — PX: TOTAL KNEE ARTHROPLASTY: SHX125

## 2018-09-16 LAB — TYPE AND SCREEN
ABO/RH(D): AB POS
Antibody Screen: NEGATIVE

## 2018-09-16 SURGERY — ARTHROPLASTY, KNEE, TOTAL
Anesthesia: Spinal | Site: Knee | Laterality: Right

## 2018-09-16 MED ORDER — PHENYLEPHRINE 40 MCG/ML (10ML) SYRINGE FOR IV PUSH (FOR BLOOD PRESSURE SUPPORT)
PREFILLED_SYRINGE | INTRAVENOUS | Status: AC
  Filled 2018-09-16: qty 10

## 2018-09-16 MED ORDER — PHENOL 1.4 % MT LIQD
1.0000 | OROMUCOSAL | Status: DC | PRN
  Filled 2018-09-16: qty 177

## 2018-09-16 MED ORDER — METHOCARBAMOL 500 MG IVPB - SIMPLE MED
500.0000 mg | Freq: Four times a day (QID) | INTRAVENOUS | Status: DC | PRN
  Administered 2018-09-16: 500 mg via INTRAVENOUS
  Filled 2018-09-16: qty 50

## 2018-09-16 MED ORDER — HYDROMORPHONE HCL 2 MG PO TABS
4.0000 mg | ORAL_TABLET | ORAL | Status: DC | PRN
  Administered 2018-09-16 – 2018-09-18 (×6): 4 mg via ORAL
  Filled 2018-09-16 (×6): qty 2

## 2018-09-16 MED ORDER — MEPERIDINE HCL 50 MG/ML IJ SOLN: 6.2500 mg | INTRAMUSCULAR | Status: DC | PRN

## 2018-09-16 MED ORDER — MENTHOL 3 MG MT LOZG: 1.0000 | LOZENGE | OROMUCOSAL | Status: DC | PRN

## 2018-09-16 MED ORDER — PROPOFOL 10 MG/ML IV BOLUS
INTRAVENOUS | Status: AC
  Filled 2018-09-16: qty 40

## 2018-09-16 MED ORDER — STERILE WATER FOR IRRIGATION IR SOLN
Status: DC | PRN
  Administered 2018-09-16: 2000 mL

## 2018-09-16 MED ORDER — DOCUSATE SODIUM 100 MG PO CAPS
100.0000 mg | ORAL_CAPSULE | Freq: Two times a day (BID) | ORAL | Status: DC
  Administered 2018-09-16 – 2018-09-17 (×3): 100 mg via ORAL
  Filled 2018-09-16 (×6): qty 1

## 2018-09-16 MED ORDER — SODIUM CHLORIDE (PF) 0.9 % IJ SOLN
INTRAMUSCULAR | Status: AC
  Filled 2018-09-16: qty 50

## 2018-09-16 MED ORDER — METOCLOPRAMIDE HCL 5 MG PO TABS: 5.0000 mg | ORAL_TABLET | Freq: Three times a day (TID) | ORAL | Status: DC | PRN

## 2018-09-16 MED ORDER — CHLORHEXIDINE GLUCONATE 4 % EX LIQD: 60.0000 mL | Freq: Once | CUTANEOUS | Status: DC

## 2018-09-16 MED ORDER — HYDROMORPHONE HCL 2 MG PO TABS
2.0000 mg | ORAL_TABLET | ORAL | Status: DC | PRN
  Administered 2018-09-16 (×2): 2 mg via ORAL
  Filled 2018-09-16 (×2): qty 1

## 2018-09-16 MED ORDER — HYDROMORPHONE HCL 2 MG PO TABS: 2.0000 mg | ORAL_TABLET | Freq: Four times a day (QID) | ORAL | 0 refills | Status: DC | PRN

## 2018-09-16 MED ORDER — MIDAZOLAM HCL 2 MG/2ML IJ SOLN
1.0000 mg | Freq: Once | INTRAMUSCULAR | Status: AC
  Administered 2018-09-16: 2 mg via INTRAVENOUS
  Filled 2018-09-16: qty 2

## 2018-09-16 MED ORDER — METHOCARBAMOL 500 MG PO TABS
500.0000 mg | ORAL_TABLET | Freq: Four times a day (QID) | ORAL | Status: DC | PRN
  Administered 2018-09-16 – 2018-09-19 (×6): 500 mg via ORAL
  Filled 2018-09-16 (×6): qty 1

## 2018-09-16 MED ORDER — CEFAZOLIN SODIUM-DEXTROSE 2-4 GM/100ML-% IV SOLN
2.0000 g | INTRAVENOUS | Status: AC
  Administered 2018-09-16: 2 g via INTRAVENOUS
  Filled 2018-09-16: qty 100

## 2018-09-16 MED ORDER — SODIUM CHLORIDE 0.9 % IR SOLN
Status: DC | PRN
  Administered 2018-09-16: 1000 mL

## 2018-09-16 MED ORDER — CEFAZOLIN SODIUM-DEXTROSE 2-4 GM/100ML-% IV SOLN
2.0000 g | Freq: Four times a day (QID) | INTRAVENOUS | Status: AC
  Administered 2018-09-16 (×2): 2 g via INTRAVENOUS
  Filled 2018-09-16 (×2): qty 100

## 2018-09-16 MED ORDER — SODIUM CHLORIDE (PF) 0.9 % IJ SOLN
INTRAMUSCULAR | Status: AC
  Filled 2018-09-16: qty 10

## 2018-09-16 MED ORDER — BUPIVACAINE IN DEXTROSE 0.75-8.25 % IT SOLN
INTRATHECAL | Status: DC | PRN
  Administered 2018-09-16: 1.8 mL via INTRATHECAL

## 2018-09-16 MED ORDER — BUPIVACAINE HCL (PF) 0.25 % IJ SOLN
INTRAMUSCULAR | Status: AC
  Filled 2018-09-16: qty 30

## 2018-09-16 MED ORDER — RIVAROXABAN 10 MG PO TABS
10.0000 mg | ORAL_TABLET | Freq: Every day | ORAL | Status: DC
  Administered 2018-09-17 – 2018-09-19 (×3): 10 mg via ORAL
  Filled 2018-09-16 (×3): qty 1

## 2018-09-16 MED ORDER — METOCLOPRAMIDE HCL 5 MG/ML IJ SOLN: 5.0000 mg | Freq: Three times a day (TID) | INTRAMUSCULAR | Status: DC | PRN

## 2018-09-16 MED ORDER — METHOCARBAMOL 500 MG PO TABS: 500.0000 mg | ORAL_TABLET | Freq: Four times a day (QID) | ORAL | Status: DC | PRN

## 2018-09-16 MED ORDER — DEXAMETHASONE SODIUM PHOSPHATE 10 MG/ML IJ SOLN
8.0000 mg | Freq: Once | INTRAMUSCULAR | Status: AC
  Administered 2018-09-16: 8 mg via INTRAVENOUS

## 2018-09-16 MED ORDER — ACETAMINOPHEN 10 MG/ML IV SOLN
1000.0000 mg | Freq: Four times a day (QID) | INTRAVENOUS | Status: DC
  Administered 2018-09-16: 1000 mg via INTRAVENOUS
  Filled 2018-09-16: qty 100

## 2018-09-16 MED ORDER — TRANEXAMIC ACID-NACL 1000-0.7 MG/100ML-% IV SOLN
1000.0000 mg | INTRAVENOUS | Status: AC
  Administered 2018-09-16: 1000 mg via INTRAVENOUS
  Filled 2018-09-16: qty 100

## 2018-09-16 MED ORDER — FENTANYL CITRATE (PF) 100 MCG/2ML IJ SOLN
INTRAMUSCULAR | Status: AC
  Filled 2018-09-16: qty 2

## 2018-09-16 MED ORDER — METHOCARBAMOL 500 MG IVPB - SIMPLE MED: 500.0000 mg | Freq: Four times a day (QID) | INTRAVENOUS | Status: DC | PRN

## 2018-09-16 MED ORDER — ONDANSETRON HCL 4 MG/2ML IJ SOLN
INTRAMUSCULAR | Status: DC | PRN
  Administered 2018-09-16: 4 mg via INTRAVENOUS

## 2018-09-16 MED ORDER — ONDANSETRON HCL 4 MG/2ML IJ SOLN
INTRAMUSCULAR | Status: AC
  Filled 2018-09-16: qty 2

## 2018-09-16 MED ORDER — TRANEXAMIC ACID-NACL 1000-0.7 MG/100ML-% IV SOLN
1000.0000 mg | Freq: Once | INTRAVENOUS | Status: AC
  Administered 2018-09-16: 1000 mg via INTRAVENOUS
  Filled 2018-09-16: qty 100

## 2018-09-16 MED ORDER — METHOCARBAMOL 500 MG PO TABS: 500.0000 mg | ORAL_TABLET | Freq: Four times a day (QID) | ORAL | 0 refills | Status: AC | PRN

## 2018-09-16 MED ORDER — PROPOFOL 10 MG/ML IV BOLUS
INTRAVENOUS | Status: AC
  Filled 2018-09-16: qty 20

## 2018-09-16 MED ORDER — METHOCARBAMOL 500 MG IVPB - SIMPLE MED
INTRAVENOUS | Status: AC
  Filled 2018-09-16: qty 50

## 2018-09-16 MED ORDER — FLEET ENEMA 7-19 GM/118ML RE ENEM: 1.0000 | ENEMA | Freq: Once | RECTAL | Status: DC | PRN

## 2018-09-16 MED ORDER — LACTATED RINGERS IV SOLN
INTRAVENOUS | Status: DC
  Administered 2018-09-16 (×2): via INTRAVENOUS

## 2018-09-16 MED ORDER — BUPIVACAINE LIPOSOME 1.3 % IJ SUSP
INTRAMUSCULAR | Status: DC | PRN
  Administered 2018-09-16: 20 mL

## 2018-09-16 MED ORDER — PROPOFOL 500 MG/50ML IV EMUL
INTRAVENOUS | Status: DC | PRN
  Administered 2018-09-16: 100 ug/kg/min via INTRAVENOUS

## 2018-09-16 MED ORDER — ONDANSETRON HCL 4 MG/2ML IJ SOLN: 4.0000 mg | Freq: Four times a day (QID) | INTRAMUSCULAR | Status: DC | PRN

## 2018-09-16 MED ORDER — POLYETHYLENE GLYCOL 3350 17 G PO PACK: 17.0000 g | PACK | Freq: Every day | ORAL | Status: DC | PRN

## 2018-09-16 MED ORDER — HYDROMORPHONE HCL 1 MG/ML IJ SOLN: 0.5000 mg | INTRAMUSCULAR | Status: DC | PRN

## 2018-09-16 MED ORDER — PHENYLEPHRINE 40 MCG/ML (10ML) SYRINGE FOR IV PUSH (FOR BLOOD PRESSURE SUPPORT)
PREFILLED_SYRINGE | INTRAVENOUS | Status: DC | PRN
  Administered 2018-09-16 (×5): 80 ug via INTRAVENOUS

## 2018-09-16 MED ORDER — ONDANSETRON HCL 4 MG PO TABS: 4.0000 mg | ORAL_TABLET | Freq: Four times a day (QID) | ORAL | Status: DC | PRN

## 2018-09-16 MED ORDER — DEXAMETHASONE SODIUM PHOSPHATE 10 MG/ML IJ SOLN
INTRAMUSCULAR | Status: AC
  Filled 2018-09-16: qty 1

## 2018-09-16 MED ORDER — SODIUM CHLORIDE 0.9 % IV SOLN
INTRAVENOUS | Status: DC
  Administered 2018-09-16 – 2018-09-17 (×2): via INTRAVENOUS

## 2018-09-16 MED ORDER — DIPHENHYDRAMINE HCL 12.5 MG/5ML PO ELIX
12.5000 mg | ORAL_SOLUTION | ORAL | Status: DC | PRN
  Administered 2018-09-16 – 2018-09-17 (×2): 25 mg via ORAL
  Filled 2018-09-16 (×2): qty 10

## 2018-09-16 MED ORDER — LIDOCAINE 2% (20 MG/ML) 5 ML SYRINGE
INTRAMUSCULAR | Status: AC
  Filled 2018-09-16: qty 5

## 2018-09-16 MED ORDER — DULOXETINE HCL 60 MG PO CPEP
60.0000 mg | ORAL_CAPSULE | Freq: Every day | ORAL | Status: DC
  Administered 2018-09-17 – 2018-09-19 (×3): 60 mg via ORAL
  Filled 2018-09-16 (×3): qty 1

## 2018-09-16 MED ORDER — ATORVASTATIN CALCIUM 40 MG PO TABS
40.0000 mg | ORAL_TABLET | Freq: Every day | ORAL | Status: DC
  Administered 2018-09-16 – 2018-09-18 (×3): 40 mg via ORAL
  Filled 2018-09-16 (×3): qty 1

## 2018-09-16 MED ORDER — RIVAROXABAN 10 MG PO TABS: 10.0000 mg | ORAL_TABLET | Freq: Every day | ORAL | 0 refills | Status: AC

## 2018-09-16 MED ORDER — FENTANYL CITRATE (PF) 100 MCG/2ML IJ SOLN
50.0000 ug | Freq: Once | INTRAMUSCULAR | Status: AC
  Administered 2018-09-16: 100 ug via INTRAVENOUS
  Filled 2018-09-16: qty 2

## 2018-09-16 MED ORDER — FENTANYL CITRATE (PF) 100 MCG/2ML IJ SOLN
25.0000 ug | INTRAMUSCULAR | Status: DC | PRN
  Administered 2018-09-16: 50 ug via INTRAVENOUS

## 2018-09-16 MED ORDER — BISACODYL 10 MG RE SUPP: 10.0000 mg | Freq: Every day | RECTAL | Status: DC | PRN

## 2018-09-16 MED ORDER — DEXAMETHASONE SODIUM PHOSPHATE 10 MG/ML IJ SOLN
10.0000 mg | Freq: Once | INTRAMUSCULAR | Status: AC
  Administered 2018-09-17: 10 mg via INTRAVENOUS
  Filled 2018-09-16: qty 1

## 2018-09-16 MED ORDER — LIDOCAINE 2% (20 MG/ML) 5 ML SYRINGE
INTRAMUSCULAR | Status: DC | PRN
  Administered 2018-09-16: 75 mg via INTRAVENOUS

## 2018-09-16 MED ORDER — SODIUM CHLORIDE (PF) 0.9 % IJ SOLN
INTRAMUSCULAR | Status: DC | PRN
  Administered 2018-09-16: 60 mL

## 2018-09-16 MED ORDER — GABAPENTIN 300 MG PO CAPS
600.0000 mg | ORAL_CAPSULE | Freq: Every day | ORAL | Status: DC
  Administered 2018-09-16 – 2018-09-18 (×3): 600 mg via ORAL
  Filled 2018-09-16 (×3): qty 2

## 2018-09-16 MED ORDER — ACETAMINOPHEN 500 MG PO TABS
1000.0000 mg | ORAL_TABLET | Freq: Four times a day (QID) | ORAL | Status: AC
  Administered 2018-09-16 – 2018-09-17 (×4): 1000 mg via ORAL
  Filled 2018-09-16 (×4): qty 2

## 2018-09-16 MED ORDER — OXYCODONE HCL 5 MG PO TABS: 5.0000 mg | ORAL_TABLET | Freq: Four times a day (QID) | ORAL | 0 refills | Status: DC | PRN

## 2018-09-16 MED ORDER — ROPIVACAINE HCL 7.5 MG/ML IJ SOLN
INTRAMUSCULAR | Status: DC | PRN
  Administered 2018-09-16: 30 mL via PERINEURAL

## 2018-09-16 SURGICAL SUPPLY — 66 items
ATTUNE MED DOME PAT 41 KNEE (Knees) ×1 IMPLANT
ATTUNE MED DOME PAT 41MM KNEE (Knees) ×1 IMPLANT
ATTUNE PS FEM RT SZ 7 CEM KNEE (Femur) ×2 IMPLANT
ATTUNE PSRP INSR SZ7 7 KNEE (Insert) ×1 IMPLANT
ATTUNE PSRP INSR SZ7 7MM KNEE (Insert) ×1 IMPLANT
BAG SPEC THK2 15X12 ZIP CLS (MISCELLANEOUS) ×1
BAG ZIPLOCK 12X15 (MISCELLANEOUS) ×3 IMPLANT
BANDAGE ACE 6X5 VEL STRL LF (GAUZE/BANDAGES/DRESSINGS) ×3 IMPLANT
BASE TIBIAL ROT PLAT SZ 8 KNEE (Knees) IMPLANT
BLADE SAG 18X100X1.27 (BLADE) ×3 IMPLANT
BLADE SAW SGTL 11.0X1.19X90.0M (BLADE) ×3 IMPLANT
BLADE SURG SZ10 CARB STEEL (BLADE) ×6 IMPLANT
BNDG CMPR MED 15X6 ELC VLCR LF (GAUZE/BANDAGES/DRESSINGS) ×1
BNDG ELASTIC 6X15 VLCR STRL LF (GAUZE/BANDAGES/DRESSINGS) ×2 IMPLANT
BOWL SMART MIX CTS (DISPOSABLE) ×3 IMPLANT
BSPLAT TIB 8 CMNT ROT PLAT STR (Knees) ×1 IMPLANT
CEMENT HV SMART SET (Cement) ×6 IMPLANT
CLOSURE STERI-STRIP 1/2X4 (GAUZE/BANDAGES/DRESSINGS) ×1
CLOSURE WOUND 1/2 X4 (GAUZE/BANDAGES/DRESSINGS) ×2
CLSR STERI-STRIP ANTIMIC 1/2X4 (GAUZE/BANDAGES/DRESSINGS) ×1 IMPLANT
COVER SURGICAL LIGHT HANDLE (MISCELLANEOUS) ×3 IMPLANT
COVER WAND RF STERILE (DRAPES) ×2 IMPLANT
CUFF TOURN SGL QUICK 34 (TOURNIQUET CUFF) ×3
CUFF TRNQT CYL 34X4X40X1 (TOURNIQUET CUFF) ×1 IMPLANT
DECANTER SPIKE VIAL GLASS SM (MISCELLANEOUS) ×3 IMPLANT
DRAPE U-SHAPE 47X51 STRL (DRAPES) ×3 IMPLANT
DRSG ADAPTIC 3X8 NADH LF (GAUZE/BANDAGES/DRESSINGS) ×3 IMPLANT
DRSG PAD ABDOMINAL 8X10 ST (GAUZE/BANDAGES/DRESSINGS) ×3 IMPLANT
DURAPREP 26ML APPLICATOR (WOUND CARE) ×3 IMPLANT
ELECT REM PT RETURN 15FT ADLT (MISCELLANEOUS) ×3 IMPLANT
EVACUATOR 1/8 PVC DRAIN (DRAIN) ×3 IMPLANT
GAUZE SPONGE 4X4 12PLY STRL (GAUZE/BANDAGES/DRESSINGS) ×3 IMPLANT
GLOVE BIO SURGEON STRL SZ7 (GLOVE) ×3 IMPLANT
GLOVE BIO SURGEON STRL SZ8 (GLOVE) ×3 IMPLANT
GLOVE BIOGEL PI IND STRL 6.5 (GLOVE) ×1 IMPLANT
GLOVE BIOGEL PI IND STRL 7.0 (GLOVE) ×1 IMPLANT
GLOVE BIOGEL PI IND STRL 8 (GLOVE) ×1 IMPLANT
GLOVE BIOGEL PI INDICATOR 6.5 (GLOVE) ×2
GLOVE BIOGEL PI INDICATOR 7.0 (GLOVE) ×2
GLOVE BIOGEL PI INDICATOR 8 (GLOVE) ×2
GLOVE SURG SS PI 6.5 STRL IVOR (GLOVE) ×3 IMPLANT
GOWN STRL REUS W/TWL LRG LVL3 (GOWN DISPOSABLE) ×6 IMPLANT
GOWN STRL REUS W/TWL XL LVL3 (GOWN DISPOSABLE) ×3 IMPLANT
HANDPIECE INTERPULSE COAX TIP (DISPOSABLE) ×3
HOLDER FOLEY CATH W/STRAP (MISCELLANEOUS) ×2 IMPLANT
IMMOBILIZER KNEE 20 (SOFTGOODS) ×3
IMMOBILIZER KNEE 20 THIGH 36 (SOFTGOODS) ×1 IMPLANT
MANIFOLD NEPTUNE II (INSTRUMENTS) ×3 IMPLANT
NS IRRIG 1000ML POUR BTL (IV SOLUTION) ×3 IMPLANT
PACK TOTAL KNEE CUSTOM (KITS) ×3 IMPLANT
PAD ABD 7.5X8 STRL (GAUZE/BANDAGES/DRESSINGS) ×2 IMPLANT
PADDING CAST COTTON 6X4 STRL (CAST SUPPLIES) ×5 IMPLANT
PIN STEINMAN FIXATION KNEE (PIN) ×2 IMPLANT
PROTECTOR NERVE ULNAR (MISCELLANEOUS) ×3 IMPLANT
SET HNDPC FAN SPRY TIP SCT (DISPOSABLE) ×1 IMPLANT
STRIP CLOSURE SKIN 1/2X4 (GAUZE/BANDAGES/DRESSINGS) ×4 IMPLANT
SUT MNCRL AB 4-0 PS2 18 (SUTURE) ×3 IMPLANT
SUT STRATAFIX 0 PDS 27 VIOLET (SUTURE) ×3
SUT VIC AB 2-0 CT1 27 (SUTURE) ×9
SUT VIC AB 2-0 CT1 TAPERPNT 27 (SUTURE) ×3 IMPLANT
SUTURE STRATFX 0 PDS 27 VIOLET (SUTURE) ×1 IMPLANT
TIBIAL BASE ROT PLAT SZ 8 KNEE (Knees) ×3 IMPLANT
TRAY FOLEY MTR SLVR 16FR STAT (SET/KITS/TRAYS/PACK) ×3 IMPLANT
WATER STERILE IRR 1000ML POUR (IV SOLUTION) ×6 IMPLANT
WRAP KNEE MAXI GEL POST OP (GAUZE/BANDAGES/DRESSINGS) ×3 IMPLANT
YANKAUER SUCT BULB TIP 10FT TU (MISCELLANEOUS) ×3 IMPLANT

## 2018-09-16 NOTE — Anesthesia Procedure Notes (Signed)
Anesthesia Regional Block: Adductor canal block   Pre-Anesthetic Checklist: ,, timeout performed, Correct Patient, Correct Site, Correct Laterality, Correct Procedure, Correct Position, site marked, Risks and benefits discussed,  Surgical consent,  Pre-op evaluation,  At surgeon's request and post-op pain management  Laterality: Right  Prep: chloraprep       Needles:  Injection technique: Single-shot  Needle Type: Echogenic Stimulator Needle     Needle Length: 5cm  Needle Gauge: 22     Additional Needles:   Procedures:, nerve stimulator,,, ultrasound used (permanent image in chart),,,,  Narrative:  Start time: 09/16/2018 10:00 AM End time: 09/16/2018 10:02 AM Injection made incrementally with aspirations every 5 mL.  Performed by: Personally  Anesthesiologist: Janeece Riggers, MD  Additional Notes: Functioning IV was confirmed and monitors were applied.  A 59mm 22ga Arrow echogenic stimulator needle was used. Sterile prep and drape,hand hygiene and sterile gloves were used. Ultrasound guidance: relevant anatomy identified, needle position confirmed, local anesthetic spread visualized around nerve(s)., vascular puncture avoided.  Image printed for medical record. Negative aspiration and negative test dose prior to incremental administration of local anesthetic. The patient tolerated the procedure well.

## 2018-09-16 NOTE — Op Note (Signed)
OPERATIVE REPORT-TOTAL KNEE ARTHROPLASTY   Pre-operative diagnosis- Osteoarthritis Right knee(s)  Post-operative diagnosis- Osteoarthritis Right knee(s)  Procedure-  Right Total Knee Arthroplasty  Surgeon- Bradley Spencer. Bradley Wolfinger, MD  Assistant- Bradley Duty, PA-C   Anesthesia-  Adductor canal block and spinal  EBL-25 ml   Drains Hemovac  Tourniquet time-  Total Tourniquet Time Documented: Thigh (Right) - 44 minutes Total: Thigh (Right) - 44 minutes     Complications- None  Condition-PACU - hemodynamically stable.   Brief Clinical Note   Bradley Spencer is a 77 y.o. year old male with end stage OA of his right knee with progressively worsening pain and dysfunction. He has constant pain, with activity and at rest and significant functional deficits with difficulties even with ADLs. He has had extensive non-op management including analgesics, injections of cortisone and viscosupplements, and home exercise program, but remains in significant pain with significant dysfunction. Radiographs show bone on bone arthritis medial and patellofemoral. He presents now for left Total Knee Arthroplasty.    Procedure in detail---   The patient is brought into the operating room and positioned supine on the operating table. After successful administration of  Adductor canal block and spinal,   a tourniquet is placed high on the  Right thigh(s) and the lower extremity is prepped and draped in the usual sterile fashion. Time out is performed by the operating team and then the  Right lower extremity is wrapped in Esmarch, knee flexed and the tourniquet inflated to 300 mmHg.       A midline incision is made with a ten blade through the subcutaneous tissue to the level of the extensor mechanism. A fresh blade is used to make a medial parapatellar arthrotomy. Soft tissue over the proximal medial tibia is subperiosteally elevated to the joint line with a knife and into the semimembranosus bursa with a Cobb  elevator. Soft tissue over the proximal lateral tibia is elevated with attention being paid to avoiding the patellar tendon on the tibial tubercle. The patella is everted, knee flexed 90 degrees and the ACL and PCL are removed. Findings are bone on bone medial and patellofemoral with large global osteophytes.        The drill is used to create a starting hole in the distal femur and the canal is thoroughly irrigated with sterile saline to remove the fatty contents. The 5 degree Right  valgus alignment guide is placed into the femoral canal and the distal femoral cutting block is pinned to remove 9 mm off the distal femur. Resection is made with an oscillating saw.      The tibia is subluxed forward and the menisci are removed. The extramedullary alignment guide is placed referencing proximally at the medial aspect of the tibial tubercle and distally along the second metatarsal axis and tibial crest. The block is pinned to remove 72mm off the more deficient medial  side. Resection is made with an oscillating saw. Size 8is the most appropriate size for the tibia and the proximal tibia is prepared with the modular drill and keel punch for that size.      The femoral sizing guide is placed and size 7 is most appropriate. Rotation is marked off the epicondylar axis and confirmed by creating a rectangular flexion gap at 90 degrees. The size 7 cutting block is pinned in this rotation and the anterior, posterior and chamfer cuts are made with the oscillating saw. The intercondylar block is then placed and that cut is made.  Trial size 8 tibial component, trial size 7 posterior stabilized femur and a 7  mm posterior stabilized rotating platform insert trial is placed. Full extension is achieved with excellent varus/valgus and anterior/posterior balance throughout full range of motion. The patella is everted and thickness measured to be 27  mm. Free hand resection is taken to 15 mm, a 41 template is placed, lug holes  are drilled, trial patella is placed, and it tracks normally. Osteophytes are removed off the posterior femur with the trial in place. All trials are removed and the cut bone surfaces prepared with pulsatile lavage. Cement is mixed and once ready for implantation, the size 8 tibial implant, size  7 posterior stabilized femoral component, and the size 41 patella are cemented in place and the patella is held with the clamp. The trial insert is placed and the knee held in full extension. The Exparel (20 ml mixed with 60 ml saline) is injected into the extensor mechanism, posterior capsule, medial and lateral gutters and subcutaneous tissues.  All extruded cement is removed and once the cement is hard the permanent 7 mm posterior stabilized rotating platform insert is placed into the tibial tray.      The wound is copiously irrigated with saline solution and the extensor mechanism closed over a hemovac drain with #1 V-loc suture. The tourniquet is released for a total tourniquet time of 44  minutes. Flexion against gravity is 140 degrees and the patella tracks normally. Subcutaneous tissue is closed with 2.0 vicryl and subcuticular with running 4.0 Monocryl. The incision is cleaned and dried and steri-strips and a bulky sterile dressing are applied. The limb is placed into a knee immobilizer and the patient is awakened and transported to recovery in stable condition.      Please note that a surgical assistant was a medical necessity for this procedure in order to perform it in a safe and expeditious manner. Surgical assistant was necessary to retract the ligaments and vital neurovascular structures to prevent injury to them and also necessary for proper positioning of the limb to allow for anatomic placement of the prosthesis.   Bradley Spencer Bradley Therien, MD    09/16/2018, 11:30 AM

## 2018-09-16 NOTE — Anesthesia Preprocedure Evaluation (Addendum)
Anesthesia Evaluation  Patient identified by MRN, date of birth, ID band Patient awake    Reviewed: Allergy & Precautions, H&P , NPO status , Patient's Chart, lab work & pertinent test results, reviewed documented beta blocker date and time   Airway Mallampati: II  TM Distance: >3 FB Neck ROM: full    Dental no notable dental hx.    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 Prox RCA 60% Very small OM1 has 80% prox stenosis. Normal LV function--EF 70%   Neuro/Psych negative neurological ROS  negative psych ROS   GI/Hepatic Neg liver ROS, hiatal hernia, GERD  ,  Endo/Other  negative endocrine ROS  Renal/GU negative Renal ROS  negative genitourinary   Musculoskeletal   Abdominal   Peds  Hematology negative hematology ROS (+)   Anesthesia Other Findings   Reproductive/Obstetrics negative OB ROS                            Anesthesia Physical Anesthesia Plan  ASA: III  Anesthesia Plan: Spinal   Post-op Pain Management:  Regional for Post-op pain   Induction:   PONV Risk Score and Plan: 1 and Treatment may vary due to age or medical condition and Ondansetron  Airway Management Planned: Nasal Cannula and Natural Airway  Additional Equipment:   Intra-op Plan:   Post-operative Plan:   Informed Consent: I have reviewed the patients History and Physical, chart, labs and discussed the procedure including the risks, benefits and alternatives for the proposed anesthesia with the patient or authorized representative who has indicated his/her understanding and acceptance.   Dental Advisory Given  Plan Discussed with: CRNA, Anesthesiologist  and Surgeon  Anesthesia Plan Comments: (  )        Anesthesia Quick Evaluation

## 2018-09-16 NOTE — Transfer of Care (Signed)
Immediate Anesthesia Transfer of Care Note  Patient: Bradley Spencer  Procedure(s) Performed: TOTAL KNEE ARTHROPLASTY (Right Knee)  Patient Location: PACU  Anesthesia Type:Spinal and MAC combined with regional for post-op pain  Level of Consciousness: awake, alert , oriented and patient cooperative  Airway & Oxygen Therapy: Patient Spontanous Breathing and Patient connected to face mask oxygen  Post-op Assessment: Report given to RN and Post -op Vital signs reviewed and stable  Post vital signs: Reviewed and stable  Last Vitals:  Vitals Value Taken Time  BP 102/72 09/16/2018 12:01 PM  Temp    Pulse 92 09/16/2018 12:02 PM  Resp    SpO2 100 % 09/16/2018 12:02 PM  Vitals shown include unvalidated device data.  Last Pain:  Vitals:   09/16/18 0812  TempSrc: Oral         Complications: No apparent anesthesia complications

## 2018-09-16 NOTE — Discharge Instructions (Addendum)
° °Dr. Frank Aluisio °Total Joint Specialist °[facility] °[address] °[place], [place] [address] °[phone] ° °TOTAL KNEE REPLACEMENT POSTOPERATIVE DIRECTIONS ° °Knee Rehabilitation, Guidelines Following Surgery  °Results after knee surgery are often greatly improved when you follow the exercise, range of motion and muscle strengthening exercises prescribed by your doctor. Safety measures are also important to protect the knee from further injury. Any time any of these exercises cause you to have increased pain or swelling in your knee joint, decrease the amount until you are comfortable again and slowly increase them. If you have problems or questions, call your caregiver or physical therapist for advice.  ° °HOME CARE INSTRUCTIONS  °• Remove items at home which could result in a fall. This includes throw rugs or furniture in walking pathways.  °· ICE to the affected knee every three hours for 30 minutes at a time and then as needed for pain and swelling.  Continue to use ice on the knee for pain and swelling from surgery. You may notice swelling that will progress down to the foot and ankle.  This is normal after surgery.  Elevate the leg when you are not up walking on it.   °· Continue to use the breathing machine which will help keep your temperature down.  It is common for your temperature to cycle up and down following surgery, especially at night when you are not up moving around and exerting yourself.  The breathing machine keeps your lungs expanded and your temperature down. °· Do not place pillow under knee, focus on keeping the knee straight while resting ° °DIET °You may resume your previous home diet once your are discharged from the hospital. ° °DRESSING / WOUND CARE / SHOWERING °You may shower 3 days after surgery, but keep the wounds dry during showering.  You may use an occlusive plastic wrap (Press'n Seal for example), NO SOAKING/SUBMERGING IN THE BATHTUB.  If the bandage  gets wet, change with a clean dry gauze.  If the incision gets wet, pat the wound dry with a clean towel. °You may start showering once you are discharged home but do not submerge the incision under water. Just pat the incision dry and apply a dry gauze dressing on daily. °Change the surgical dressing daily and reapply a dry dressing each time. ° °ACTIVITY °Walk with your walker as instructed. °Use walker as long as suggested by your caregivers. °Avoid periods of inactivity such as sitting longer than an hour when not asleep. This helps prevent blood clots.  °You may resume a sexual relationship in one month or when given the OK by your doctor.  °You may return to work once you are cleared by your doctor.  °Do not drive a car for 6 weeks or until released by you surgeon.  °Do not drive while taking narcotics. ° °WEIGHT BEARING °Weight bearing as tolerated with assist device (walker, cane, etc) as directed, use it as long as suggested by your surgeon or therapist, typically at least 4-6 weeks. ° °POSTOPERATIVE CONSTIPATION PROTOCOL °Constipation - defined medically as fewer than three stools per week and severe constipation as less than one stool per week. ° °One of the most common issues patients have following surgery is constipation.  Even if you have a regular bowel pattern at home, your normal regimen is likely to be disrupted due to multiple reasons following surgery.  Combination of anesthesia, postoperative narcotics, change in appetite and fluid intake all can affect your bowels.    In order to avoid complications following surgery, here are some recommendations in order to help you during your recovery period. ° °Colace (docusate) - Pick up an over-the-counter form of Colace or another stool softener and take twice a day as long as you are requiring postoperative pain medications.  Take with a full glass of water daily.  If you experience loose stools or diarrhea, hold the colace until you stool forms back  up.  If your symptoms do not get better within 1 week or if they get worse, check with your doctor. ° °Dulcolax (bisacodyl) - Pick up over-the-counter and take as directed by the product packaging as needed to assist with the movement of your bowels.  Take with a full glass of water.  Use this product as needed if not relieved by Colace only.  ° °MiraLax (polyethylene glycol) - Pick up over-the-counter to have on hand.  MiraLax is a solution that will increase the amount of water in your bowels to assist with bowel movements.  Take as directed and can mix with a glass of water, juice, soda, coffee, or tea.  Take if you go more than two days without a movement. °Do not use MiraLax more than once per day. Call your doctor if you are still constipated or irregular after using this medication for 7 days in a row. ° °If you continue to have problems with postoperative constipation, please contact the office for further assistance and recommendations.  If you experience "the worst abdominal pain ever" or develop nausea or vomiting, please contact the office immediatly for further recommendations for treatment. ° °ITCHING ° If you experience itching with your medications, try taking only a single pain pill, or even half a pain pill at a time.  You can also use Benadryl over the counter for itching or also to help with sleep.  ° °TED HOSE STOCKINGS °Wear the elastic stockings on both legs for three weeks following surgery during the day but you may remove then at night for sleeping. ° °MEDICATIONS °See your medication summary on the “After Visit Summary” that the nursing staff will review with you prior to discharge.  You may have some home medications which will be placed on hold until you complete the course of blood thinner medication.  It is important for you to complete the blood thinner medication as prescribed by your surgeon.  Continue your approved medications as instructed at time of discharge. ° °PRECAUTIONS °If  you experience chest pain or shortness of breath - call 911 immediately for transfer to the hospital emergency department.  °If you develop a fever greater that 101 F, purulent drainage from wound, increased redness or drainage from wound, foul odor from the wound/dressing, or calf pain - CONTACT YOUR SURGEON.   °                                                °FOLLOW-UP APPOINTMENTS °Make sure you keep all of your appointments after your operation with your surgeon and caregivers. You should call the office at the above phone number and make an appointment for approximately two weeks after the date of your surgery or on the date instructed by your surgeon outlined in the "After Visit Summary". ° ° °RANGE OF MOTION AND STRENGTHENING EXERCISES  °Rehabilitation of the knee is important following a knee injury or   an operation. After just a few days of immobilization, the muscles of the thigh which control the knee become weakened and shrink (atrophy). Knee exercises are designed to build up the tone and strength of the thigh muscles and to improve knee motion. Often times heat used for twenty to thirty minutes before working out will loosen up your tissues and help with improving the range of motion but do not use heat for the first two weeks following surgery. These exercises can be done on a training (exercise) mat, on the floor, on a table or on a bed. Use what ever works the best and is most comfortable for you Knee exercises include:  °• Leg Lifts - While your knee is still immobilized in a splint or cast, you can do straight leg raises. Lift the leg to 60 degrees, hold for 3 sec, and slowly lower the leg. Repeat 10-20 times 2-3 times daily. Perform this exercise against resistance later as your knee gets better.  °• Quad and Hamstring Sets - Tighten up the muscle on the front of the thigh (Quad) and hold for 5-10 sec. Repeat this 10-20 times hourly. Hamstring sets are done by pushing the foot backward against an  object and holding for 5-10 sec. Repeat as with quad sets.  °· Leg Slides: Lying on your back, slowly slide your foot toward your buttocks, bending your knee up off the floor (only go as far as is comfortable). Then slowly slide your foot back down until your leg is flat on the floor again. °· Angel Wings: Lying on your back spread your legs to the side as far apart as you can without causing discomfort.  °A rehabilitation program following serious knee injuries can speed recovery and prevent re-injury in the future due to weakened muscles. Contact your doctor or a physical therapist for more information on knee rehabilitation.  ° °IF YOU ARE TRANSFERRED TO A SKILLED REHAB FACILITY °If the patient is transferred to a skilled rehab facility following release from the hospital, a list of the current medications will be sent to the facility for the patient to continue.  When discharged from the skilled rehab facility, please have the facility set up the patient's Home Health Physical Therapy prior to being released. Also, the skilled facility will be responsible for providing the patient with their medications at time of release from the facility to include their pain medication, the muscle relaxants, and their blood thinner medication. If the patient is still at the rehab facility at time of the two week follow up appointment, the skilled rehab facility will also need to assist the patient in arranging follow up appointment in our office and any transportation needs. ° °MAKE SURE YOU:  °• Understand these instructions.  °• Get help right away if you are not doing well or get worse.  ° ° °Pick up stool softner and laxative for home use following surgery while on pain medications. °Do not submerge incision under water. °Please use good hand washing techniques while changing dressing each day. °May shower starting three days after surgery. °Please use a clean towel to pat the incision dry following showers. °Continue to  use ice for pain and swelling after surgery. °Do not use any lotions or creams on the incision until instructed by your surgeon. ° °Information on my medicine - XARELTO® (Rivaroxaban) ° °This medication education was reviewed with me or my healthcare representative as part of my discharge preparation.  The pharmacist that spoke with me   during my hospital stay was:   ° °Why was Xarelto® prescribed for you? °Xarelto® was prescribed for you to reduce the risk of blood clots forming after orthopedic surgery. The medical term for these abnormal blood clots is venous thromboembolism (VTE). ° °What do you need to know about xarelto® ? °Take your Xarelto® ONCE DAILY at the same time every day. °You may take it either with or without food. ° °If you have difficulty swallowing the tablet whole, you may crush it and mix in applesauce just prior to taking your dose. ° °Take Xarelto® exactly as prescribed by your doctor and DO NOT stop taking Xarelto® without talking to the doctor who prescribed the medication.  Stopping without other VTE prevention medication to take the place of Xarelto® may increase your risk of developing a clot. ° °After discharge, you should have regular check-up appointments with your healthcare provider that is prescribing your Xarelto®.   ° °What do you do if you miss a dose? °If you miss a dose, take it as soon as you remember on the same day then continue your regularly scheduled once daily regimen the next day. Do not take two doses of Xarelto® on the same day.  ° °Important Safety Information °A possible side effect of Xarelto® is bleeding. You should call your healthcare provider right away if you experience any of the following: °? Bleeding from an injury or your nose that does not stop. °? Unusual colored urine (red or dark brown) or unusual colored stools (red or black). °? Unusual bruising for unknown reasons. °? A serious fall or if you hit your head (even if there is no bleeding). ° °Some  medicines may interact with Xarelto® and might increase your risk of bleeding while on Xarelto®. To help avoid this, consult your healthcare provider or pharmacist prior to using any new prescription or non-prescription medications, including herbals, vitamins, non-steroidal anti-inflammatory drugs (NSAIDs) and supplements. ° °This website has more information on Xarelto®: www.xarelto.com. ° ° ° ° °

## 2018-09-16 NOTE — Interval H&P Note (Signed)
History and Physical Interval Note:  09/16/2018 8:20 AM  Bradley Spencer  has presented today for surgery, with the diagnosis of right knee osteoarthritis  The various methods of treatment have been discussed with the patient and family. After consideration of risks, benefits and other options for treatment, the patient has consented to  Procedure(s) with comments: TOTAL KNEE ARTHROPLASTY (Right) - 82min as a surgical intervention .  The patient's history has been reviewed, patient examined, no change in status, stable for surgery.  I have reviewed the patient's chart and labs.  Questions were answered to the patient's satisfaction.     Pilar Plate Olar Santini

## 2018-09-16 NOTE — Anesthesia Postprocedure Evaluation (Signed)
Anesthesia Post Note  Patient: Bradley Spencer  Procedure(s) Performed: TOTAL KNEE ARTHROPLASTY (Right Knee)     Patient location during evaluation: PACU Anesthesia Type: Spinal Level of consciousness: oriented and awake and alert Pain management: pain level controlled Vital Signs Assessment: post-procedure vital signs reviewed and stable Respiratory status: spontaneous breathing, respiratory function stable and patient connected to nasal cannula oxygen Cardiovascular status: blood pressure returned to baseline and stable Postop Assessment: no headache, no backache and no apparent nausea or vomiting Anesthetic complications: no    Last Vitals:  Vitals:   09/16/18 1315 09/16/18 1330  BP: 128/74 124/83  Pulse: 92 92  Resp: 17 15  Temp: 36.7 C (!) 36.4 C  SpO2: 100% 95%    Last Pain:  Vitals:   09/16/18 1330  TempSrc: Oral  PainSc: 0-No pain                 Jammi Morrissette

## 2018-09-16 NOTE — Anesthesia Procedure Notes (Signed)
Spinal  Patient location during procedure: OR End time: 09/16/2018 10:22 AM Staffing Resident/CRNA: Noralyn Pick D, CRNA Performed: resident/CRNA  Preanesthetic Checklist Completed: patient identified, site marked, surgical consent, pre-op evaluation, timeout performed, IV checked, risks and benefits discussed and monitors and equipment checked Spinal Block Patient position: sitting Prep: Betadine and DuraPrep Patient monitoring: heart rate, continuous pulse ox and blood pressure Approach: midline Location: L2-3 Injection technique: single-shot Needle Needle type: Sprotte  Needle gauge: 24 G Needle length: 9 cm Assessment Sensory level: T6 Additional Notes Expiration date of kit checked and confirmed. Patient tolerated procedure well, without complications.

## 2018-09-16 NOTE — Evaluation (Signed)
Physical Therapy Evaluation Patient Details Name: Bradley Spencer MRN: [id number] DOB: 03-Dec-1940 Today's Date: 09/16/2018   History of Present Illness  s/p R TKA  Clinical Impression  Pt is s/p TKA resulting in the deficits listed below (see PT Problem List). * Pt able to transfer to chair with assist or 2, limited d/t residual effects of spinal, amb deferred; should continue to progress well; pt is hopeful to d/c tomorrow  Pt will benefit from skilled PT to increase their independence and safety with mobility to allow discharge to the venue listed below.      Follow Up Recommendations Follow surgeon's recommendation for DC plan and follow-up therapies    Equipment Recommendations  None recommended by PT    Recommendations for Other Services       Precautions / Restrictions Precautions Precautions: Fall Required Braces or Orthoses: Knee Immobilizer - Right Knee Immobilizer - Right: Discontinue once straight leg raise with < 10 degree lag Restrictions Weight Bearing Restrictions: No      Mobility  Bed Mobility Overal bed mobility: Needs Assistance Bed Mobility: Supine to Sit     Supine to sit: Min assist;Min guard     General bed mobility comments: light guidance/assist with RLE  Transfers Overall transfer level: Needs assistance Equipment used: Rolling walker (2 wheeled) Transfers: Sit to/from [facility] Sit to Stand: Mod assist;+2 physical assistance;From elevated surface;Min assist Stand pivot transfers: Mod assist;Max assist;+2 physical assistance;+2 safety/equipment       General transfer comment: assist to transition to Rw adn stabilize, cues for hand placement; assist with wt shift for pivot to chiar, heavily reliant on UEs while wt shifting d/t residual gluteal numbness from spinal  Ambulation/Gait                Stairs            Wheelchair Mobility    Modified Rankin (Stroke Patients Only)       Balance                                              Pertinent Vitals/Pain Pain Assessment: 0-10 Pain Score: 2  Pain Location: right knee Pain Descriptors / Indicators: Aching Pain Intervention(s): Limited activity within patient's tolerance;Monitored during session    Home Living Family/patient expects to be discharged to:: Private residence Living Arrangements: Spouse/significant other   Type of Home: House Home Access: Stairs to enter   Technical brewer of Steps: 1 Home Layout: Multi-level(split level) Home Equipment: Environmental consultant - 2 wheels;Bedside commode      Prior Function Level of Independence: Independent               Hand Dominance        Extremity/Trunk Assessment   Upper Extremity Assessment Upper Extremity Assessment: Overall WFL for tasks assessed    Lower Extremity Assessment Lower Extremity Assessment: RLE deficits/detail RLE Deficits / Details: ankle WFL, knee 2+/5, hip 2+/5; RLE Sensation: decreased proprioception(sensation intact to light touch, decr proprioception d/t  spinal)       Communication   Communication: No difficulties  Cognition Arousal/Alertness: Awake/alert Behavior During Therapy: WFL for tasks assessed/performed Overall Cognitive Status: Within Functional Limits for tasks assessed  General Comments      Exercises Total Joint Exercises Ankle Circles/Pumps: AROM;Both;10 reps Quad Sets: AROM;Both;5 reps Heel Slides: AAROM;Right;5 reps   Assessment/Plan    PT Assessment Patient needs continued PT services  PT Problem List Decreased strength;Decreased mobility;Decreased range of motion;Decreased activity tolerance;Decreased knowledge of use of DME       PT Treatment Interventions Gait training;DME instruction;Functional mobility training;Therapeutic activities;Balance training;Patient/family education;Stair training;Therapeutic exercise    PT Goals (Current  goals can be found in the Care Plan section)  Acute Rehab PT Goals Patient Stated Goal: walk PT Goal Formulation: With patient Time For Goal Achievement: 09/23/18 Potential to Achieve Goals: Good    Frequency 7X/week   Barriers to discharge        Co-evaluation               AM-PAC PT "6 Clicks" Mobility  Outcome Measure Help needed turning from your back to your side while in a flat bed without using bedrails?: A Little Help needed moving from lying on your back to sitting on the side of a flat bed without using bedrails?: A Little Help needed moving to and from a bed to a chair (including a wheelchair)?: A Lot Help needed standing up from a chair using your arms (e.g., wheelchair or bedside chair)?: A Lot Help needed to walk in hospital room?: Total Help needed climbing 3-5 steps with a railing? : Total 6 Click Score: 12    End of Session Equipment Utilized During Treatment: Gait belt;Right knee immobilizer Activity Tolerance: Patient tolerated treatment well;Other (comment)(limited d/t residual effects of spinal) Patient left: in chair;with family/visitor present;with call bell/phone within reach   PT Visit Diagnosis: Unsteadiness on feet (R26.81)    Time: 7510-2585 PT Time Calculation (min) (ACUTE ONLY): 29 min   Charges:   PT Evaluation $PT Eval Low Complexity: 1 Low PT Treatments $Therapeutic Activity: 8-22 mins        Kenyon Ana, PT  Pager: [phone] Acute Rehab Dept [facility]): [phone]   09/16/2018   [facility] 09/16/2018, 5:02 PM

## 2018-09-16 NOTE — Progress Notes (Signed)
AssistedDr. Oddono with right, ultrasound guided, adductor canal block. Side rails up, monitors on throughout procedure. See vital signs in flow sheet. Tolerated Procedure well.  

## 2018-09-17 LAB — CBC
HCT: 40.2 % (ref 39.0–52.0)
Hemoglobin: 12.4 g/dL — ABNORMAL LOW (ref 13.0–17.0)
MCH: 29.2 pg (ref 26.0–34.0)
MCHC: 30.8 g/dL (ref 30.0–36.0)
MCV: 94.6 fL (ref 80.0–100.0)
Platelets: 235 10*3/uL (ref 150–400)
RBC: 4.25 MIL/uL (ref 4.22–5.81)
RDW: 13.1 % (ref 11.5–15.5)
WBC: 12.3 10*3/uL — ABNORMAL HIGH (ref 4.0–10.5)
nRBC: 0 % (ref 0.0–0.2)

## 2018-09-17 LAB — BASIC METABOLIC PANEL
Anion gap: 6 (ref 5–15)
BUN: 12 mg/dL (ref 8–23)
CO2: 28 mmol/L (ref 22–32)
Calcium: 8.5 mg/dL — ABNORMAL LOW (ref 8.9–10.3)
Chloride: 104 mmol/L (ref 98–111)
Creatinine, Ser: 0.93 mg/dL (ref 0.61–1.24)
GFR calc Af Amer: >60 mL/min (ref >60)
GFR calc non Af Amer: >60 mL/min (ref >60)
GLUCOSE: 163 mg/dL — AB (ref 70–99)
Potassium: 4.5 mmol/L (ref 3.5–5.1)
Sodium: 138 mmol/L (ref 135–145)

## 2018-09-17 NOTE — Care Management Note (Signed)
Case Management Note  Patient Details  Name: Bradley Spencer MRN: [id number] Date of Birth: 08-29-1941  Subjective/Objective:                    Action/Plan:Pt discharging home with wife. Will follow up with OP rehab at [facility]. No DME needed.   Expected Discharge Date:  09/17/18               Expected Discharge Plan:  OP Rehab  In-House Referral:     Discharge planning Services  CM Consult  Post Acute Care Choice:    Choice offered to:     DME Arranged:    DME Agency:     HH Arranged:    HH Agency:     Status of Service:  Completed, signed off  If discussed at [employer] of Stay Meetings, dates discussed:    Additional CommentsPurcell Mouton, RN 09/17/2018, 12:14 PM

## 2018-09-17 NOTE — Progress Notes (Signed)
Physical Therapy Treatment Patient Details Name: Bradley Spencer MRN: [id number] DOB: 07-Jul-1941 Today's Date: 09/17/2018    History of Present Illness s/p R TKA    PT Comments    Pt progressing however continues to need mod assist to come to standing, constant cues for gait sequencing and safety; pt will likely need to stay one more day for safe d/c home; will see again in pm  Follow Up Recommendations  Follow surgeon's recommendation for DC plan and follow-up therapies     Equipment Recommendations  None recommended by PT    Recommendations for Other Services       Precautions / Restrictions Precautions Precautions: Fall;Knee Required Braces or Orthoses: Knee Immobilizer - Right Knee Immobilizer - Right: Discontinue once straight leg raise with < 10 degree lag Restrictions Weight Bearing Restrictions: No    Mobility  Bed Mobility Overal bed mobility: Needs Assistance Bed Mobility: Supine to Sit     Supine to sit: Min assist     General bed mobility comments: incr time, effortful, heavy use of rails  Transfers Overall transfer level: Needs assistance Equipment used: Rolling walker (2 wheeled) Transfers: Sit to/from Stand Sit to Stand: Mod assist;From elevated surface         General transfer comment: assist to rise and stabilize, incr time, bed elevated; cues for hand placement   Ambulation/Gait Ambulation/Gait assistance: Min assist Gait Distance (Feet): 65 Feet Assistive device: Rolling walker (2 wheeled) Gait Pattern/deviations: Step-to pattern;Decreased stance time - left;Decreased weight shift to left;Trunk flexed     General Gait Details: multi-modal cues for RW position, sequence, step length, chair follow for safety   Stairs             Wheelchair Mobility    Modified Rankin (Stroke Patients Only)       Balance Overall balance assessment: Needs assistance   Sitting balance-Leahy Scale: Fair       Standing balance-Leahy  Scale: Poor Standing balance comment: reliant on UEs                            Cognition Arousal/Alertness: Awake/alert Behavior During Therapy: WFL for tasks assessed/performed Overall Cognitive Status: Within Functional Limits for tasks assessed                                        Exercises Total Joint Exercises Ankle Circles/Pumps: AROM;Both;10 reps Quad Sets: AROM;Both;10 reps Heel Slides: AAROM;Right;10 reps Straight Leg Raises: AAROM;10 reps;Right Goniometric ROM: 8* to 80*    General Comments        Pertinent Vitals/Pain Pain Assessment: 0-10 Pain Score: 4  Pain Location: right knee Pain Descriptors / Indicators: Aching Pain Intervention(s): Limited activity within patient's tolerance;Monitored during session;Premedicated before session;Repositioned;Ice applied    Home Living                      Prior Function            PT Goals (current goals can now be found in the care plan section) Acute Rehab PT Goals Patient Stated Goal: walk PT Goal Formulation: With patient Time For Goal Achievement: 09/23/18 Potential to Achieve Goals: Good Progress towards PT goals: Progressing toward goals    Frequency    7X/week      PT Plan Current plan remains appropriate    Co-evaluation  AM-PAC PT "6 Clicks" Mobility   Outcome Measure  Help needed turning from your back to your side while in a flat bed without using bedrails?: A Little Help needed moving from lying on your back to sitting on the side of a flat bed without using bedrails?: A Little Help needed moving to and from a bed to a chair (including a wheelchair)?: A Little Help needed standing up from a chair using your arms (e.g., wheelchair or bedside chair)?: A Little Help needed to walk in hospital room?: A Lot Help needed climbing 3-5 steps with a railing? : A Lot 6 Click Score: 16    End of Session Equipment Utilized During Treatment:  Gait belt;Right knee immobilizer Activity Tolerance: Patient tolerated treatment well Patient left: in chair;with call bell/phone within reach;with chair alarm set   PT Visit Diagnosis: Unsteadiness on feet (R26.81)     Time: 2575-0518 PT Time Calculation (min) (ACUTE ONLY): 33 min  Charges:  $Gait Training: 8-22 mins $Therapeutic Exercise: 8-22 mins                     Kenyon Ana, PT  Pager: [phone] Acute Rehab Dept [facility]): [phone]   09/17/2018    [facility] 09/17/2018, 11:26 AM

## 2018-09-17 NOTE — Progress Notes (Signed)
09/17/18 1400  PT Visit Information  Last PT Received On 09/17/18  Progressing slowly, pt remains with unsteady gait and mod assist for transfers; has 8 steps at home, pt wife will have more help tomorrow if needed, pt is hopeful to d/c tomorrow if meets goals  Assistance Needed +1  History of Present Illness s/p R TKA  Subjective Data  Patient Stated Goal walk  Precautions  Precautions Fall;Knee  Required Braces or Orthoses Knee Immobilizer - Right  Knee Immobilizer - Right Discontinue once straight leg raise with < 10 degree lag  Restrictions  Weight Bearing Restrictions No  Pain Assessment  Pain Assessment 0-10  Pain Score 5  Pain Location right knee  Pain Descriptors / Indicators Aching  Pain Intervention(s) Limited activity within patient's tolerance;Monitored during session;Premedicated before session;Repositioned  Cognition  Arousal/Alertness Awake/alert  Behavior During Therapy WFL for tasks assessed/performed  Overall Cognitive Status Within Functional Limits for tasks assessed  Bed Mobility  Overal bed mobility Needs Assistance  Bed Mobility Supine to Sit  Supine to sit Min assist  General bed mobility comments incr time, effortful, heavy use of rails  Transfers  Overall transfer level Needs assistance  Equipment used Rolling walker (2 wheeled)  Transfers Sit to/from Stand  Sit to Stand Mod assist  General transfer comment assist to rise and stabilize, incr time, bed elevated; cues for hand placement   Ambulation/Gait  Ambulation/Gait assistance Min assist  Gait Distance (Feet) 60 Feet  Assistive device Rolling walker (2 wheeled)  Gait Pattern/deviations Step-to pattern;Decreased stance time - left;Decreased weight shift to left;Trunk flexed  General Gait Details multi-modal cues for RW position, sequence, step length, chair follow for safety  Balance  Overall balance assessment Needs assistance  Sitting balance-Leahy Scale Fair  Standing balance-Leahy Scale  Poor  Standing balance comment reliant on UEs  Total Joint Exercises  Ankle Circles/Pumps AROM;Both;10 reps  PT - End of Session  Equipment Utilized During Treatment Gait belt;Right knee immobilizer  Activity Tolerance Patient tolerated treatment well  Patient left with call bell/phone within reach;in bed;with bed alarm set   PT - Assessment/Plan  PT Plan Current plan remains appropriate  PT Visit Diagnosis Unsteadiness on feet (R26.81)  PT Frequency (ACUTE ONLY) 7X/week  Follow Up Recommendations Follow surgeon's recommendation for DC plan and follow-up therapies  PT equipment None recommended by PT  AM-PAC PT "6 Clicks" Mobility Outcome Measure (Version 2)  Help needed turning from your back to your side while in a flat bed without using bedrails? 3  Help needed moving from lying on your back to sitting on the side of a flat bed without using bedrails? 3  Help needed moving to and from a bed to a chair (including a wheelchair)? 3  Help needed standing up from a chair using your arms (e.g., wheelchair or bedside chair)? 3  Help needed to walk in hospital room? 3  Help needed climbing 3-5 steps with a railing?  2  6 Click Score 17  Consider Recommendation of Discharge To: Home with [facility]  PT Goal Progression  Progress towards PT goals Progressing toward goals  Acute Rehab PT Goals  PT Goal Formulation With patient  Time For Goal Achievement 09/23/18  Potential to Achieve Goals Good  PT Time Calculation  PT Start Time (ACUTE ONLY) 1427  PT Stop Time (ACUTE ONLY) 1446  PT Time Calculation (min) (ACUTE ONLY) 19 min  PT General Charges  $$ ACUTE PT VISIT 1 Visit  PT Treatments  $Gait  Training 8-22 mins

## 2018-09-17 NOTE — Progress Notes (Signed)
   Subjective: 1 Day Post-Op Procedure(s) (LRB): TOTAL KNEE ARTHROPLASTY (Right) Patient reports pain as mild.   We will start therapy today.  Plan is to go Home after hospital stay.  Objective: Vital signs in last 24 hours: Temp:  [97.4 F (36.3 C)-98.3 F (36.8 C)] 97.8 F (36.6 C) (12/24 6294) Pulse Rate:  [74-100] 96 (12/24 0633) Resp:  [12-34] 12 (12/24 0633) BP: (102-142)/(72-95) 128/85 (12/24 0633) SpO2:  [91 %-100 %] 99 % (12/24 7654) Weight:  [109.8 kg] 109.8 kg (12/23 1336)  Intake/Output from previous day:  Intake/Output Summary (Last 24 hours) at 09/17/2018 0716 Last data filed at 09/17/2018 0545 Gross per 24 hour  Intake 3501.63 ml  Output 2500 ml  Net 1001.63 ml    Intake/Output this shift: No intake/output data recorded.  Labs: Recent Labs    09/17/18 0538  HGB 12.4*   Recent Labs    09/17/18 0538  WBC 12.3*  RBC 4.25  HCT 40.2  PLT 235   Recent Labs    09/17/18 0538  NA 138  K 4.5  CL 104  CO2 28  BUN 12  CREATININE 0.93  GLUCOSE 163*  CALCIUM 8.5*   No results for input(s): LABPT, INR in the last 72 hours.  EXAM General - Patient is Alert, Appropriate and Oriented Extremity - Neurologically intact Neurovascular intact No cellulitis present Compartment soft Dressing - dressing C/D/I Motor Function - intact, moving foot and toes well on exam.  Hemovac pulled without difficulty.  Past Medical History:  Diagnosis Date  . Abnormal EKG    1 degree av block   . Arthritis   . CAD (coronary artery disease)   . Cancer [name]    prostate   . Dyslipidemia   . Ear infection 09/11/2018   reports the doctor started him on 5 day abx ear drop course  today   . GERD (gastroesophageal reflux disease)   . History of hiatal hernia   . History of kidney stones   . Hypertension    no meds   . Lower leg edema    bilateral   . Neuropathy   . Pre-diabetes    per patient   . SOB (shortness of breath)     Assessment/Plan: 1 Day  Post-Op Procedure(s) (LRB): TOTAL KNEE ARTHROPLASTY (Right) Principal Problem:   OA (osteoarthritis) of knee Active Problems:   Osteoarthritis of right knee   Advance diet Up with therapy D/C IV fluids  D/C home this afternoon or tomorrow depending on progress with PT  DVT Prophylaxis - Xarelto Weight-Bearing as tolerated to right leg   Bradley Spencer 09/17/2018, 7:16 AM

## 2018-09-18 LAB — BASIC METABOLIC PANEL
Anion gap: 8 (ref 5–15)
BUN: 14 mg/dL (ref 8–23)
CO2: 26 mmol/L (ref 22–32)
CREATININE: 0.96 mg/dL (ref 0.61–1.24)
Calcium: 8.7 mg/dL — ABNORMAL LOW (ref 8.9–10.3)
Chloride: 102 mmol/L (ref 98–111)
GFR calc Af Amer: >60 mL/min (ref >60)
GFR calc non Af Amer: >60 mL/min (ref >60)
Glucose, Bld: 161 mg/dL — ABNORMAL HIGH (ref 70–99)
Potassium: 4.2 mmol/L (ref 3.5–5.1)
Sodium: 136 mmol/L (ref 135–145)

## 2018-09-18 LAB — CBC
HCT: 38.3 % — ABNORMAL LOW (ref 39.0–52.0)
HEMOGLOBIN: 11.7 g/dL — AB (ref 13.0–17.0)
MCH: 29 pg (ref 26.0–34.0)
MCHC: 30.5 g/dL (ref 30.0–36.0)
MCV: 95 fL (ref 80.0–100.0)
Platelets: 231 10*3/uL (ref 150–400)
RBC: 4.03 MIL/uL — ABNORMAL LOW (ref 4.22–5.81)
RDW: 13.4 % (ref 11.5–15.5)
WBC: 11.2 10*3/uL — ABNORMAL HIGH (ref 4.0–10.5)
nRBC: 0 % (ref 0.0–0.2)

## 2018-09-18 MED ORDER — OXYCODONE HCL 5 MG PO TABS: 5.0000 mg | ORAL_TABLET | Freq: Four times a day (QID) | ORAL | 0 refills | Status: DC | PRN

## 2018-09-18 MED ORDER — OXYCODONE HCL 5 MG PO TABS
5.0000 mg | ORAL_TABLET | Freq: Four times a day (QID) | ORAL | Status: DC | PRN
  Administered 2018-09-18 – 2018-09-19 (×4): 10 mg via ORAL
  Filled 2018-09-18 (×4): qty 2

## 2018-09-18 NOTE — Progress Notes (Signed)
   Subjective: 2 Days Post-Op Procedure(s) (LRB): TOTAL KNEE ARTHROPLASTY (Right) Patient reports pain as moderate.   Patient seen in rounds for Dr. Wynelle Link. Patient is well, and has had no acute complaints or problems other than discomfort in the right knee. No SOB or chest pain. Voiding well. Positive flatus.    Objective: Vital signs in last 24 hours: Temp:  [97.5 F (36.4 C)-98.5 F (36.9 C)] 98.5 F (36.9 C) (12/25 0648) Pulse Rate:  [97-108] 99 (12/25 0648) Resp:  [14-16] 16 (12/25 0648) BP: (114-128)/(64-90) 114/76 (12/25 0648) SpO2:  [90 %-93 %] 92 % (12/25 0648)  Intake/Output from previous day:  Intake/Output Summary (Last 24 hours) at 09/18/2018 0813 Last data filed at 09/17/2018 2125 Gross per 24 hour  Intake 1303.51 ml  Output 400 ml  Net 903.51 ml     Labs: Recent Labs    09/17/18 0538 09/18/18 0525  HGB 12.4* 11.7*   Recent Labs    09/17/18 0538 09/18/18 0525  WBC 12.3* 11.2*  RBC 4.25 4.03*  HCT 40.2 38.3*  PLT 235 231   Recent Labs    09/17/18 0538 09/18/18 0525  NA 138 136  K 4.5 4.2  CL 104 102  CO2 28 26  BUN 12 14  CREATININE 0.93 0.96  GLUCOSE 163* 161*  CALCIUM 8.5* 8.7*    EXAM General - Patient is Alert and Oriented Extremity - Neurologically intact Intact pulses distally Dorsiflexion/Plantar flexion intact No cellulitis present Compartment soft Dressing/Incision - clean, dry, no drainage Motor Function - intact, moving foot and toes well on exam.   Past Medical History:  Diagnosis Date  . Abnormal EKG    1 degree av block   . Arthritis   . CAD (coronary artery disease)   . Cancer [facility])    prostate   . Dyslipidemia   . Ear infection 09/11/2018   reports the doctor started him on 5 day abx ear drop course  today   . GERD (gastroesophageal reflux disease)   . History of hiatal hernia   . History of kidney stones   . Hypertension    no meds   . Lower leg edema    bilateral   . Neuropathy   . Pre-diabetes      per patient   . SOB (shortness of breath)     Assessment/Plan: 2 Days Post-Op Procedure(s) (LRB): TOTAL KNEE ARTHROPLASTY (Right) Principal Problem:   OA (osteoarthritis) of knee Active Problems:   Osteoarthritis of right knee  Estimated body mass index is 36.8 kg/m as calculated from the following:   Height as of this encounter: 5\' 8"  (1.727 m).   Weight as of this encounter: 109.8 kg. Advance diet Up with therapy  DVT Prophylaxis - Xarelto Weight-Bearing as tolerated   Continue therapy today. Plan for DC home if he is meeting his goals. Follow up in 2 weeks.   Ardeen Jourdain, PA-C Orthopaedic Surgery 09/18/2018, 8:13 AM

## 2018-09-18 NOTE — Progress Notes (Signed)
Physical Therapy Treatment Patient Details Name: Bradley Spencer MRN: [id number] DOB: 1941/09/22 Today's Date: 09/18/2018    History of Present Illness s/p R TKA    PT Comments     Pt continues very ltd by pain with WB and fatigues easily with noted deterioration in balance.  Pt has 8+1 stairs into house and may be a candidate for HHPT follow up vs original plan of OP PT.  Follow Up Recommendations  Follow surgeon's recommendation for DC plan and follow-up therapies;Home health PT     Equipment Recommendations  None recommended by PT    Recommendations for Other Services       Precautions / Restrictions Precautions Precautions: Fall;Knee Required Braces or Orthoses: Knee Immobilizer - Right Knee Immobilizer - Right: Discontinue once straight leg raise with < 10 degree lag Restrictions Weight Bearing Restrictions: No    Mobility  Bed Mobility Overal bed mobility: Needs Assistance Bed Mobility: Sit to Supine       Sit to supine: Min assist   General bed mobility comments: min assist to manage R LE  Transfers Overall transfer level: Needs assistance Equipment used: Rolling walker (2 wheeled) Transfers: Sit to/from Stand Sit to Stand: Min assist;Mod assist         General transfer comment: assist to rise and stabilize, incr time, cues for hand placement and LE management  Ambulation/Gait Ambulation/Gait assistance: Min assist;Mod assist Gait Distance (Feet): 14 Feet Assistive device: Rolling walker (2 wheeled) Gait Pattern/deviations: Step-to pattern;Decreased stance time - left;Decreased weight shift to left;Trunk flexed Gait velocity: decr   General Gait Details: multi-modal cues for RW position, sequence, step length, pt increasingly unsteady with increasing fatigue, chair follow for safety.  Distance ltd by pain with WB   Stairs             Wheelchair Mobility    Modified Rankin (Stroke Patients Only)       Balance Overall balance  assessment: Needs assistance Sitting-balance support: No upper extremity supported;Feet supported Sitting balance-Leahy Scale: Good     Standing balance support: Bilateral upper extremity supported Standing balance-Leahy Scale: Poor Standing balance comment: reliant on UEs                            Cognition Arousal/Alertness: Awake/alert Behavior During Therapy: WFL for tasks assessed/performed Overall Cognitive Status: Within Functional Limits for tasks assessed                                        Exercises      General Comments        Pertinent Vitals/Pain Pain Assessment: 0-10 Pain Score: 10-Worst pain ever(with WB) Pain Location: right knee Pain Descriptors / Indicators: Aching;Grimacing;Guarding;Sore Pain Intervention(s): Limited activity within patient's tolerance;Monitored during session;Premedicated before session;Ice applied    Home Living                      Prior Function            PT Goals (current goals can now be found in the care plan section) Acute Rehab PT Goals Patient Stated Goal: walk PT Goal Formulation: With patient Time For Goal Achievement: 09/23/18 Potential to Achieve Goals: Good Progress towards PT goals: Not progressing toward goals - comment(Pain ltd WB tolerance; fatigues easily)    Frequency    7X/week  PT Plan Discharge plan needs to be updated    Co-evaluation              AM-PAC PT "6 Clicks" Mobility   Outcome Measure  Help needed turning from your back to your side while in a flat bed without using bedrails?: A Little Help needed moving from lying on your back to sitting on the side of a flat bed without using bedrails?: A Little Help needed moving to and from a bed to a chair (including a wheelchair)?: A Lot Help needed standing up from a chair using your arms (e.g., wheelchair or bedside chair)?: A Lot Help needed to walk in hospital room?: A Lot Help needed  climbing 3-5 steps with a railing? : A Lot 6 Click Score: 14    End of Session Equipment Utilized During Treatment: Gait belt;Right knee immobilizer Activity Tolerance: Patient limited by pain Patient left: in bed;with call bell/phone within reach;with family/visitor present Nurse Communication: Mobility status PT Visit Diagnosis: Unsteadiness on feet (R26.81)     Time: 6578-4696 PT Time Calculation (min) (ACUTE ONLY): 18 min  Charges:  $Gait Training: 8-22 mins                    [place] Pager [phone] Office [phone]    Marshal Schrecengost 09/18/2018, 4:08 PM

## 2018-09-18 NOTE — Progress Notes (Signed)
Physical Therapy Treatment Patient Details Name: Bradley Spencer MRN: [id number] DOB: 1940-11-15 Today's Date: 09/18/2018    History of Present Illness s/p R TKA    PT Comments    Pt cooperative but ltd by pain with WB despite premed - RN aware.   Follow Up Recommendations  Follow surgeon's recommendation for DC plan and follow-up therapies     Equipment Recommendations  None recommended by PT    Recommendations for Other Services       Precautions / Restrictions Precautions Precautions: Fall;Knee Required Braces or Orthoses: Knee Immobilizer - Right Knee Immobilizer - Right: Discontinue once straight leg raise with < 10 degree lag Restrictions Weight Bearing Restrictions: No    Mobility  Bed Mobility Overal bed mobility: Needs Assistance Bed Mobility: Supine to Sit     Supine to sit: Min assist     General bed mobility comments: incr time, effortful, heavy use of rails  Transfers Overall transfer level: Needs assistance Equipment used: Rolling walker (2 wheeled) Transfers: Sit to/from Stand Sit to Stand: Min assist         General transfer comment: assist to rise and stabilize, incr time, bed elevated; cues for hand placement   Ambulation/Gait Ambulation/Gait assistance: Min assist Gait Distance (Feet): 11 Feet Assistive device: Rolling walker (2 wheeled) Gait Pattern/deviations: Step-to pattern;Decreased stance time - left;Decreased weight shift to left;Trunk flexed Gait velocity: decr   General Gait Details: multi-modal cues for RW position, sequence, step length, chair follow for safety.  Distance ltd by pain with WB   Stairs             Wheelchair Mobility    Modified Rankin (Stroke Patients Only)       Balance Overall balance assessment: Needs assistance   Sitting balance-Leahy Scale: Good       Standing balance-Leahy Scale: Poor Standing balance comment: reliant on UEs                            Cognition  Arousal/Alertness: Awake/alert Behavior During Therapy: WFL for tasks assessed/performed Overall Cognitive Status: Within Functional Limits for tasks assessed                                        Exercises Total Joint Exercises Ankle Circles/Pumps: AROM;Both;20 reps;Supine Quad Sets: AROM;Both;15 reps;Supine Heel Slides: AAROM;Right;15 reps;Supine Straight Leg Raises: AAROM;10 reps;Right Goniometric ROM: -10 - 75    General Comments        Pertinent Vitals/Pain Pain Assessment: 0-10 Pain Score: 10-Worst pain ever Pain Location: right knee Pain Descriptors / Indicators: Aching;Grimacing;Guarding;Sore Pain Intervention(s): Limited activity within patient's tolerance    Home Living                      Prior Function            PT Goals (current goals can now be found in the care plan section) Acute Rehab PT Goals Patient Stated Goal: walk PT Goal Formulation: With patient Time For Goal Achievement: 09/23/18 Potential to Achieve Goals: Good Progress towards PT goals: Progressing toward goals    Frequency    7X/week      PT Plan Current plan remains appropriate    Co-evaluation              AM-PAC PT "6 Clicks" Mobility   Outcome Measure  Help needed turning from your back to your side while in a flat bed without using bedrails?: A Little Help needed moving from lying on your back to sitting on the side of a flat bed without using bedrails?: A Little Help needed moving to and from a bed to a chair (including a wheelchair)?: A Little Help needed standing up from a chair using your arms (e.g., wheelchair or bedside chair)?: A Little Help needed to walk in hospital room?: A Little Help needed climbing 3-5 steps with a railing? : A Lot 6 Click Score: 17    End of Session Equipment Utilized During Treatment: Gait belt;Right knee immobilizer Activity Tolerance: Patient limited by pain Patient left: in chair;with call bell/phone  within reach;with family/visitor present Nurse Communication: Mobility status PT Visit Diagnosis: Unsteadiness on feet (R26.81)     Time: 0940-1005 PT Time Calculation (min) (ACUTE ONLY): 25 min  Charges:  $Gait Training: 8-22 mins $Therapeutic Exercise: 8-22 mins                     [place] Pager [phone] Office [phone]    Lolitha Tortora 09/18/2018, 12:33 PM

## 2018-09-19 ENCOUNTER — Encounter (HOSPITAL_COMMUNITY): Payer: Self-pay | Admitting: Orthopedic Surgery

## 2018-09-19 LAB — CBC
HEMATOCRIT: 36.2 % — AB (ref 39.0–52.0)
Hemoglobin: 11.1 g/dL — ABNORMAL LOW (ref 13.0–17.0)
MCH: 29.4 pg (ref 26.0–34.0)
MCHC: 30.7 g/dL (ref 30.0–36.0)
MCV: 96 fL (ref 80.0–100.0)
Platelets: 209 10*3/uL (ref 150–400)
RBC: 3.77 MIL/uL — ABNORMAL LOW (ref 4.22–5.81)
RDW: 13.6 % (ref 11.5–15.5)
WBC: 10.7 10*3/uL — ABNORMAL HIGH (ref 4.0–10.5)
nRBC: 0 % (ref 0.0–0.2)

## 2018-09-19 MED ORDER — OXYCODONE HCL 5 MG PO TABS: 5.0000 mg | ORAL_TABLET | ORAL | 0 refills | Status: AC | PRN

## 2018-09-19 NOTE — Progress Notes (Signed)
   Subjective: 3 Days Post-Op Procedure(s) (LRB): TOTAL KNEE ARTHROPLASTY (Right) Patient reports pain as moderate.   Patient seen in rounds for Dr. Wynelle Link. Patient is well, and has had no acute complaints or problems other than pain in the right knee. He is finding therapy difficult because of weightbearing pain. No SOB or chest pain. Voiding well. Positive flatus.  Plan is to go Home after hospital stay.  Objective: Vital signs in last 24 hours: Temp:  [98.1 F (36.7 C)-98.6 F (37 C)] 98.4 F (36.9 C) (12/26 0550) Pulse Rate:  [99-162] 99 (12/26 0550) Resp:  [14-16] 16 (12/26 0550) BP: (112-144)/(70-85) 144/70 (12/26 0550) SpO2:  [89 %-97 %] 94 % (12/26 0550)  Intake/Output from previous day:  Intake/Output Summary (Last 24 hours) at 09/19/2018 0735 Last data filed at 09/19/2018 0500 Gross per 24 hour  Intake 360 ml  Output 1225 ml  Net -865 ml     Labs: Recent Labs    09/17/18 0538 09/18/18 0525 09/19/18 0458  HGB 12.4* 11.7* 11.1*   Recent Labs    09/18/18 0525 09/19/18 0458  WBC 11.2* 10.7*  RBC 4.03* 3.77*  HCT 38.3* 36.2*  PLT 231 209   Recent Labs    09/17/18 0538 09/18/18 0525  NA 138 136  K 4.5 4.2  CL 104 102  CO2 28 26  BUN 12 14  CREATININE 0.93 0.96  GLUCOSE 163* 161*  CALCIUM 8.5* 8.7*    EXAM General - Patient is Alert and Oriented Extremity - Neurologically intact Intact pulses distally Dorsiflexion/Plantar flexion intact No cellulitis present Compartment soft Dressing/Incision - clean, dry, no drainage Motor Function - intact, moving foot and toes well on exam.   Past Medical History:  Diagnosis Date  . Abnormal EKG    1 degree av block   . Arthritis   . CAD (coronary artery disease)   . Cancer [facility])    prostate   . Dyslipidemia   . Ear infection 09/11/2018   reports the doctor started him on 5 day abx ear drop course  today   . GERD (gastroesophageal reflux disease)   . History of hiatal hernia   . History of  kidney stones   . Hypertension    no meds   . Lower leg edema    bilateral   . Neuropathy   . Pre-diabetes    per patient   . SOB (shortness of breath)     Assessment/Plan: 3 Days Post-Op Procedure(s) (LRB): TOTAL KNEE ARTHROPLASTY (Right) Principal Problem:   OA (osteoarthritis) of knee Active Problems:   Osteoarthritis of right knee  Estimated body mass index is 36.8 kg/m as calculated from the following:   Height as of this encounter: 5\' 8"  (1.727 m).   Weight as of this encounter: 109.8 kg. Advance diet Up with therapy  DVT Prophylaxis - Xarelto Weight-Bearing as tolerated   Continue therapy. Plan for DC home if meeting goals. Outpatient therapy upon DC.   Ardeen Jourdain, PA-C Orthopaedic Surgery 09/19/2018, 7:35 AM

## 2018-09-19 NOTE — Progress Notes (Signed)
Physical Therapy Treatment Patient Details Name: Bradley Spencer MRN: [id number] DOB: 03-11-1941 Today's Date: 09/19/2018    History of Present Illness s/p R TKA    PT Comments    Pt cooperative this am and completed therex program.  OOB deferred to after bfast.   Follow Up Recommendations  Follow surgeon's recommendation for DC plan and follow-up therapies;Home health PT     Equipment Recommendations  None recommended by PT    Recommendations for Other Services       Precautions / Restrictions Precautions Precautions: Fall;Knee Required Braces or Orthoses: Knee Immobilizer - Right Knee Immobilizer - Right: Discontinue once straight leg raise with < 10 degree lag Restrictions Weight Bearing Restrictions: No    Mobility  Bed Mobility               General bed mobility comments: OOB deferred to after bfast  Transfers                    Ambulation/Gait                 Stairs             Wheelchair Mobility    Modified Rankin (Stroke Patients Only)       Balance                                            Cognition Arousal/Alertness: Awake/alert Behavior During Therapy: WFL for tasks assessed/performed Overall Cognitive Status: Within Functional Limits for tasks assessed                                        Exercises Total Joint Exercises Ankle Circles/Pumps: AROM;Both;20 reps;Supine Quad Sets: AROM;Both;15 reps;Supine Heel Slides: AAROM;Right;Supine;20 reps Straight Leg Raises: AAROM;Right;20 reps;Supine Goniometric ROM: AAROM R knee -10-  75    General Comments        Pertinent Vitals/Pain Pain Assessment: 0-10 Pain Score: 5  Pain Location: right knee Pain Descriptors / Indicators: Aching;Grimacing;Guarding;Sore Pain Intervention(s): Limited activity within patient's tolerance;Monitored during session;Premedicated before session;Ice applied    Home Living                       Prior Function            PT Goals (current goals can now be found in the care plan section) Acute Rehab PT Goals Patient Stated Goal: walk PT Goal Formulation: With patient Time For Goal Achievement: 09/23/18 Potential to Achieve Goals: Good Progress towards PT goals: Progressing toward goals    Frequency    7X/week      PT Plan Discharge plan needs to be updated    Co-evaluation              AM-PAC PT "6 Clicks" Mobility   Outcome Measure  Help needed turning from your back to your side while in a flat bed without using bedrails?: A Little Help needed moving from lying on your back to sitting on the side of a flat bed without using bedrails?: A Little Help needed moving to and from a bed to a chair (including a wheelchair)?: A Lot Help needed standing up from a chair using your arms (e.g., wheelchair or bedside chair)?: A Lot Help needed to  walk in hospital room?: A Lot Help needed climbing 3-5 steps with a railing? : A Lot 6 Click Score: 14    End of Session   Activity Tolerance: Patient tolerated treatment well Patient left: in bed;with call bell/phone within reach Nurse Communication: Mobility status PT Visit Diagnosis: Unsteadiness on feet (R26.81)     Time: 5974-7185 PT Time Calculation (min) (ACUTE ONLY): 19 min  Charges:  $Therapeutic Exercise: 8-22 mins                     Debe Coder PT Acute Rehabilitation Services Pager [phone] Office [phone]    Tattianna Schnarr 09/19/2018, 12:06 PM

## 2018-09-19 NOTE — Progress Notes (Addendum)
Physical Therapy Treatment Patient Details Name: Bradley Spencer MRN: [id number] DOB: [birth year] Today's Date: 09/19/2018    History of Present Illness s/p R TKA    PT Comments    Pt progressing with pm with mobility and demonstrating increased stability and decreased pain with ambulation.  Pt and spouse report they will use alternate entrance with NO stairs to enter and pt's brother will be able to assist. Reviewed home therex with written instruction provided.  Spouse and pt state comfortable with don/doff KI.  Follow Up Recommendations  Follow surgeon's recommendation for DC plan and follow-up therapies;Home health PT     Equipment Recommendations  None recommended by PT    Recommendations for Other Services       Precautions / Restrictions Precautions Precautions: Fall;Knee Required Braces or Orthoses: Knee Immobilizer - Right Knee Immobilizer - Right: Discontinue once straight leg raise with < 10 degree lag Restrictions Weight Bearing Restrictions: No    Mobility  Bed Mobility Overal bed mobility: Needs Assistance Bed Mobility: Supine to Sit     Supine to sit: Min assist     General bed mobility comments: min assist for R LE and use of rail  Transfers Overall transfer level: Needs assistance Equipment used: Rolling walker (2 wheeled) Transfers: Sit to/from Stand Sit to Stand: Min assist         General transfer comment: assist to rise and stabilize, incr time, cues for hand placement and LE management  Ambulation/Gait Ambulation/Gait assistance: Min guard Gait Distance (Feet): 35 Feet Assistive device: Rolling walker (2 wheeled) Gait Pattern/deviations: Step-to pattern;Decreased stance time - left;Decreased weight shift to left;Trunk flexed Gait velocity: decr   General Gait Details: multi-modal cues for RW position, sequence, step length.   Distance ltd by pain with WB   Stairs             Wheelchair Mobility    Modified Rankin (Stroke  Patients Only)       Balance Overall balance assessment: Needs assistance Sitting-balance support: No upper extremity supported;Feet supported Sitting balance-Leahy Scale: Good     Standing balance support: Bilateral upper extremity supported Standing balance-Leahy Scale: Fair Standing balance comment: reliant on UEs                            Cognition Arousal/Alertness: Awake/alert Behavior During Therapy: WFL for tasks assessed/performed Overall Cognitive Status: Within Functional Limits for tasks assessed                                        Exercises Total Joint Exercises Ankle Circles/Pumps: AROM;Both;20 reps;Supine Quad Sets: AROM;Supine;5 reps Heel Slides: AAROM;Right;Supine;10 reps Straight Leg Raises: AAROM;Right;Supine;10 reps Goniometric ROM: AAROM R knee -10-  75    General Comments        Pertinent Vitals/Pain Pain Assessment: 0-10 Pain Score: 5  Pain Location: right knee Pain Descriptors / Indicators: Aching;Sore Pain Intervention(s): Limited activity within patient's tolerance;Monitored during session;Premedicated before session;Ice applied    Home Living                      Prior Function            PT Goals (current goals can now be found in the care plan section) Acute Rehab PT Goals Patient Stated Goal: walk PT Goal Formulation: With patient Time For Goal Achievement:  09/23/18 Potential to Achieve Goals: Good Progress towards PT goals: Progressing toward goals    Frequency    7X/week      PT Plan Current plan remains appropriate    Co-evaluation              AM-PAC PT "6 Clicks" Mobility   Outcome Measure  Help needed turning from your back to your side while in a flat bed without using bedrails?: A Little Help needed moving from lying on your back to sitting on the side of a flat bed without using bedrails?: A Little Help needed moving to and from a bed to a chair (including a  wheelchair)?: A Little Help needed standing up from a chair using your arms (e.g., wheelchair or bedside chair)?: A Little Help needed to walk in hospital room?: A Little Help needed climbing 3-5 steps with a railing? : A Lot 6 Click Score: 17    End of Session Equipment Utilized During Treatment: Gait belt;Right knee immobilizer Activity Tolerance: Patient tolerated treatment well Patient left: in chair;with call bell/phone within reach;with family/visitor present Nurse Communication: Mobility status PT Visit Diagnosis: Unsteadiness on feet (R26.81)     Time: 4580-9983 PT Time Calculation (min) (ACUTE ONLY): 33 min  Charges:  $Gait Training: 8-22 mins $Therapeutic Exercise: 8-22 mins                     Debe Coder PT Acute Rehabilitation Services Pager [phone] Office [phone]    Isiaah Cuervo 09/19/2018, 1:16 PM

## 2018-09-19 NOTE — Care Management Important Message (Signed)
Important Message  Patient Details  Name: Bradley Spencer MRN: [id number] Date of Birth: Jan 11, 1941   Medicare Important Message Given:  Yes    Zareah Hunzeker 09/19/2018, 9:24 AM

## 2018-09-19 NOTE — Progress Notes (Signed)
Physical Therapy Treatment Patient Details Name: Bradley Spencer MRN: [id number] DOB: 1941/06/10 Today's Date: 09/19/2018    History of Present Illness s/p R TKA    PT Comments    Improvement in pt tolerance to WB and able to ambulate 78' with pain 8/10.   Follow Up Recommendations  Follow surgeon's recommendation for DC plan and follow-up therapies;Home health PT     Equipment Recommendations  None recommended by PT    Recommendations for Other Services       Precautions / Restrictions Precautions Precautions: Fall;Knee Required Braces or Orthoses: Knee Immobilizer - Right Knee Immobilizer - Right: Discontinue once straight leg raise with < 10 degree lag Restrictions Weight Bearing Restrictions: No    Mobility  Bed Mobility Overal bed mobility: Needs Assistance Bed Mobility: Supine to Sit     Supine to sit: Min assist     General bed mobility comments: min assist for R LE and use of rail  Transfers Overall transfer level: Needs assistance Equipment used: Rolling walker (2 wheeled) Transfers: Sit to/from Stand Sit to Stand: Min assist         General transfer comment: assist to rise and stabilize, incr time, cues for hand placement and LE management  Ambulation/Gait Ambulation/Gait assistance: Min assist;Min guard Gait Distance (Feet): 34 Feet Assistive device: Rolling walker (2 wheeled) Gait Pattern/deviations: Step-to pattern;Decreased stance time - left;Decreased weight shift to left;Trunk flexed Gait velocity: decr   General Gait Details: multi-modal cues for RW position, sequence, step length.   Distance ltd by pain with WB   Stairs             Wheelchair Mobility    Modified Rankin (Stroke Patients Only)       Balance Overall balance assessment: Needs assistance Sitting-balance support: No upper extremity supported;Feet supported Sitting balance-Leahy Scale: Good     Standing balance support: Bilateral upper extremity  supported Standing balance-Leahy Scale: Poor Standing balance comment: reliant on UEs                            Cognition Arousal/Alertness: Awake/alert Behavior During Therapy: WFL for tasks assessed/performed Overall Cognitive Status: Within Functional Limits for tasks assessed                                        Exercises Total Joint Exercises Ankle Circles/Pumps: AROM;Both;20 reps;Supine Quad Sets: AROM;Both;15 reps;Supine Heel Slides: AAROM;Right;Supine;20 reps Straight Leg Raises: AAROM;Right;20 reps;Supine Goniometric ROM: AAROM R knee -10-  75    General Comments        Pertinent Vitals/Pain Pain Assessment: 0-10 Pain Score: 5  Pain Location: right knee Pain Descriptors / Indicators: Aching;Grimacing;Guarding;Sore Pain Intervention(s): Limited activity within patient's tolerance;Monitored during session;Premedicated before session;Ice applied    Home Living                      Prior Function            PT Goals (current goals can now be found in the care plan section) Acute Rehab PT Goals Patient Stated Goal: walk PT Goal Formulation: With patient Time For Goal Achievement: 09/23/18 Potential to Achieve Goals: Good Progress towards PT goals: Progressing toward goals    Frequency    7X/week      PT Plan Current plan remains appropriate    Co-evaluation  AM-PAC PT "6 Clicks" Mobility   Outcome Measure  Help needed turning from your back to your side while in a flat bed without using bedrails?: A Little Help needed moving from lying on your back to sitting on the side of a flat bed without using bedrails?: A Little Help needed moving to and from a bed to a chair (including a wheelchair)?: A Little Help needed standing up from a chair using your arms (e.g., wheelchair or bedside chair)?: A Little Help needed to walk in hospital room?: A Little Help needed climbing 3-5 steps with a railing?  : A Lot 6 Click Score: 17    End of Session Equipment Utilized During Treatment: Gait belt;Right knee immobilizer Activity Tolerance: Patient tolerated treatment well Patient left: in chair;with call bell/phone within reach;with chair alarm set Nurse Communication: Mobility status PT Visit Diagnosis: Unsteadiness on feet (R26.81)     Time: 2563-8937 PT Time Calculation (min) (ACUTE ONLY): 15 min  Charges:  $Therapeutic Exercise: 8-22 mins                     Debe Coder PT Acute Rehabilitation Services Pager [phone] Office [phone]    Anaise Sterbenz 09/19/2018, 12:16 PM

## 2018-09-19 NOTE — Care Management Note (Signed)
Case Management Note  Patient Details  Name: Bradley Spencer MRN: [id number] Date of Birth: [birth year]  Subjective/Objective:       Discharge planning, spoke with patient and spouse at bedside. Have chosen [facility] for [name] PT, evaluate and treat.   Action/Plan: Contacted [facility] for referral. They have accepted.has DME. [phone]             Expected Discharge Date:  09/18/18               Expected Discharge Plan:  [place]  In-House Referral:  NA  Discharge planning Services  CM Consult  Post Acute Care Choice:  Home Health Choice offered to:  Patient, Spouse  DME Arranged:  N/A DME Agency:  NA  HH Arranged:  PT [place] Agency:  [facility] (formerly [employer])  Status of Service:  Completed, signed off  If discussed at [employer], dates discussed:    Additional Comments:  Guadalupe Maple, RN 09/19/2018, 11:38 AM

## 2018-09-20 ENCOUNTER — Ambulatory Visit: Payer: PPO | Admitting: Physical Therapy

## 2018-09-23 DIAGNOSIS — K219 Gastro-esophageal reflux disease without esophagitis: Secondary | ICD-10-CM | POA: Diagnosis not present

## 2018-09-23 DIAGNOSIS — Z9181 History of falling: Secondary | ICD-10-CM | POA: Diagnosis not present

## 2018-09-23 DIAGNOSIS — Z96651 Presence of right artificial knee joint: Secondary | ICD-10-CM | POA: Diagnosis not present

## 2018-09-23 DIAGNOSIS — I10 Essential (primary) hypertension: Secondary | ICD-10-CM | POA: Diagnosis not present

## 2018-09-23 DIAGNOSIS — Z87891 Personal history of nicotine dependence: Secondary | ICD-10-CM | POA: Diagnosis not present

## 2018-09-23 DIAGNOSIS — E785 Hyperlipidemia, unspecified: Secondary | ICD-10-CM | POA: Diagnosis not present

## 2018-09-23 DIAGNOSIS — G629 Polyneuropathy, unspecified: Secondary | ICD-10-CM | POA: Diagnosis not present

## 2018-09-23 DIAGNOSIS — Z8546 Personal history of malignant neoplasm of prostate: Secondary | ICD-10-CM | POA: Diagnosis not present

## 2018-09-23 DIAGNOSIS — I2510 Atherosclerotic heart disease of native coronary artery without angina pectoris: Secondary | ICD-10-CM | POA: Diagnosis not present

## 2018-09-23 DIAGNOSIS — Z7901 Long term (current) use of anticoagulants: Secondary | ICD-10-CM | POA: Diagnosis not present

## 2018-09-23 DIAGNOSIS — Z471 Aftercare following joint replacement surgery: Secondary | ICD-10-CM | POA: Diagnosis not present

## 2018-09-23 NOTE — Discharge Summary (Signed)
Physician Discharge Summary   Patient ID: Bradley Spencer MRN: [id number] DOB/AGE: 12-13-1940 77 y.o.  Admit date: 09/16/2018 Discharge date: 09/19/2018  Primary Diagnosis: Primary osteoarthritis right knee   Admission Diagnoses:  Past Medical History:  Diagnosis Date  . Abnormal EKG    1 degree av block   . Arthritis   . CAD (coronary artery disease)   . Cancer [facility])    prostate   . Dyslipidemia   . Ear infection 09/11/2018   reports the doctor started him on 5 day abx ear drop course  today   . GERD (gastroesophageal reflux disease)   . History of hiatal hernia   . History of kidney stones   . Hypertension    no meds   . Lower leg edema    bilateral   . Neuropathy   . Pre-diabetes    per patient   . SOB (shortness of breath)    Discharge Diagnoses:   Principal Problem:   OA (osteoarthritis) of knee Active Problems:   Osteoarthritis of right knee  Estimated body mass index is 36.8 kg/m as calculated from the following:   Height as of this encounter: 5\' 8"  (1.727 m).   Weight as of this encounter: 109.8 kg.  Procedure:  Procedure(s) (LRB): TOTAL KNEE ARTHROPLASTY (Right)   Consults: None  HPI:   Laboratory Data: Admission on 09/16/2018, Discharged on 09/19/2018  Component Date Value Ref Range Status  . WBC 09/17/2018 12.3* 4.0 - 10.5 K/uL Final  . RBC 09/17/2018 4.25  4.22 - 5.81 MIL/uL Final  . Hemoglobin 09/17/2018 12.4* 13.0 - 17.0 g/dL Final  . HCT 09/17/2018 40.2  39.0 - 52.0 % Final  . MCV 09/17/2018 94.6  80.0 - 100.0 fL Final  . MCH 09/17/2018 29.2  26.0 - 34.0 pg Final  . MCHC 09/17/2018 30.8  30.0 - 36.0 g/dL Final  . RDW 09/17/2018 13.1  11.5 - 15.5 % Final  . Platelets 09/17/2018 235  150 - 400 K/uL Final  . nRBC 09/17/2018 0.0  0.0 - 0.2 % Final   Performed at [facility], [place] [address] [place], [place] [address]  . Sodium 09/17/2018 138  135 - 145 mmol/L Final  . Potassium 09/17/2018 4.5  3.5 - 5.1 mmol/L Final  .  Chloride 09/17/2018 104  98 - 111 mmol/L Final  . CO2 09/17/2018 28  22 - 32 mmol/L Final  . Glucose, Bld 09/17/2018 163* 70 - 99 mg/dL Final  . BUN 09/17/2018 12  8 - 23 mg/dL Final  . Creatinine, Ser 09/17/2018 0.93  0.61 - 1.24 mg/dL Final  . Calcium 09/17/2018 8.5* 8.9 - 10.3 mg/dL Final  . GFR calc non Af Amer 09/17/2018 >60  >60 mL/min Final  . GFR calc Af Amer 09/17/2018 >60  >60 mL/min Final  . Anion gap 09/17/2018 6  5 - 15 Final   Performed at [facility], [place] [address], [place], [place] [address]  . WBC 09/18/2018 11.2* 4.0 - 10.5 K/uL Final  . RBC 09/18/2018 4.03* 4.22 - 5.81 MIL/uL Final  . Hemoglobin 09/18/2018 11.7* 13.0 - 17.0 g/dL Final  . HCT 09/18/2018 38.3* 39.0 - 52.0 % Final  . MCV 09/18/2018 95.0  80.0 - 100.0 fL Final  . MCH 09/18/2018 29.0  26.0 - 34.0 pg Final  . MCHC 09/18/2018 30.5  30.0 - 36.0 g/dL Final  . RDW 09/18/2018 13.4  11.5 - 15.5 % Final  . Platelets 09/18/2018 231  150 -  400 K/uL Final  . nRBC 09/18/2018 0.0  0.0 - 0.2 % Final   Performed at [facility], [place] [address] [place], [place] [address]  . Sodium 09/18/2018 136  135 - 145 mmol/L Final  . Potassium 09/18/2018 4.2  3.5 - 5.1 mmol/L Final  . Chloride 09/18/2018 102  98 - 111 mmol/L Final  . CO2 09/18/2018 26  22 - 32 mmol/L Final  . Glucose, Bld 09/18/2018 161* 70 - 99 mg/dL Final  . BUN 09/18/2018 14  8 - 23 mg/dL Final  . Creatinine, Ser 09/18/2018 0.96  0.61 - 1.24 mg/dL Final  . Calcium 09/18/2018 8.7* 8.9 - 10.3 mg/dL Final  . GFR calc non Af Amer 09/18/2018 >60  >60 mL/min Final  . GFR calc Af Amer 09/18/2018 >60  >60 mL/min Final  . Anion gap 09/18/2018 8  5 - 15 Final   Performed at [facility], [place] [address] [place], [place] [address]  . WBC 09/19/2018 10.7* 4.0 - 10.5 K/uL Final  . RBC 09/19/2018 3.77* 4.22 - 5.81 MIL/uL Final  . Hemoglobin 09/19/2018 11.1* 13.0 - 17.0 g/dL Final  . HCT 09/19/2018 36.2* 39.0 -  52.0 % Final  . MCV 09/19/2018 96.0  80.0 - 100.0 fL Final  . MCH 09/19/2018 29.4  26.0 - 34.0 pg Final  . MCHC 09/19/2018 30.7  30.0 - 36.0 g/dL Final  . RDW 09/19/2018 13.6  11.5 - 15.5 % Final  . Platelets 09/19/2018 209  150 - 400 K/uL Final  . nRBC 09/19/2018 0.0  0.0 - 0.2 % Final   Performed at [facility], [place] [address], [place], [place] [address]  Hospital Outpatient Visit on 09/11/2018  Component Date Value Ref Range Status  . aPTT 09/11/2018 38* 24 - 36 seconds Final   Comment:        IF BASELINE aPTT IS ELEVATED, SUGGEST PATIENT RISK ASSESSMENT BE USED TO DETERMINE APPROPRIATE ANTICOAGULANT THERAPY. Performed at [facility], [place] [address], [place], [place] [address]   . WBC 09/11/2018 9.3  4.0 - 10.5 K/uL Final  . RBC 09/11/2018 5.01  4.22 - 5.81 MIL/uL Final  . Hemoglobin 09/11/2018 14.7  13.0 - 17.0 g/dL Final  . HCT 09/11/2018 47.3  39.0 - 52.0 % Final  . MCV 09/11/2018 94.4  80.0 - 100.0 fL Final  . MCH 09/11/2018 29.3  26.0 - 34.0 pg Final  . MCHC 09/11/2018 31.1  30.0 - 36.0 g/dL Final  . RDW 09/11/2018 13.4  11.5 - 15.5 % Final  . Platelets 09/11/2018 243  150 - 400 K/uL Final  . nRBC 09/11/2018 0.0  0.0 - 0.2 % Final   Performed at [facility], [place] [address], [place],  [address]  . Sodium 09/11/2018 140  135 - 145 mmol/L Final  . Potassium 09/11/2018 4.0  3.5 - 5.1 mmol/L Final  . Chloride 09/11/2018 105  98 - 111 mmol/L Final  . CO2 09/11/2018 27  22 - 32 mmol/L Final  . Glucose, Bld 09/11/2018 143* 70 - 99 mg/dL Final  . BUN 09/11/2018 16  8 - 23 mg/dL Final  . Creatinine, Ser 09/11/2018 1.03  0.61 - 1.24 mg/dL Final  . Calcium 09/11/2018 9.5  8.9 - 10.3 mg/dL Final  . Total Protein 09/11/2018 7.1  6.5 - 8.1 g/dL Final  . Albumin 09/11/2018 3.9  3.5 - 5.0 g/dL Final  . AST 09/11/2018 18  15 - 41 U/L  Final  . ALT 09/11/2018 14  0 - 44 U/L Final  . Alkaline Phosphatase 09/11/2018 99  38  - 126 U/L Final  . Total Bilirubin 09/11/2018 0.9  0.3 - 1.2 mg/dL Final  . GFR calc non Af Amer 09/11/2018 >60  >60 mL/min Final  . GFR calc Af Amer 09/11/2018 >60  >60 mL/min Final  . Anion gap 09/11/2018 8  5 - 15 Final   Performed at [facility], [place] [address] [place], [place] [address]  . Prothrombin Time 09/11/2018 12.5  11.4 - 15.2 seconds Final  . INR 09/11/2018 0.94   Final   Performed at [facility], [place] [address] [place], [place] [address]  . ABO/RH(D) 09/11/2018 AB POS   Final  . Antibody Screen 09/11/2018 NEG   Final  . Sample Expiration 09/11/2018 09/19/2018   Final  . Extend sample reason 09/11/2018    Final                   Value:NO TRANSFUSIONS OR PREGNANCY IN THE PAST 3 MONTHS Performed at [facility] [place] [address], [place], [place] [address]   . MRSA, PCR 09/11/2018 NEGATIVE  NEGATIVE Final  . Staphylococcus aureus 09/11/2018 NEGATIVE  NEGATIVE Final   Comment: (NOTE) The Xpert SA Assay (FDA approved for NASAL specimens in patients 35 years of age and older), is one component of a comprehensive surveillance program. It is not intended to diagnose infection nor to guide or monitor treatment. Performed at [facility], [place] [address], [place], [place] [address]   . ABO/RH(D) 09/11/2018    Final                   Value:AB POS Performed at [facility], [place] [address], [place],  [address]       Hospital Course: Bradley Spencer is a 77 y.o. who was admitted to [facility]. They were brought to the operating room on 09/16/2018 and underwent Procedure(s): TOTAL KNEE ARTHROPLASTY.  Patient tolerated the procedure well and was later transferred to the recovery room and then to the orthopaedic floor for postoperative care.  They were given PO and IV analgesics for pain control following their surgery.  They were given 24 hours of postoperative antibiotics  of  Anti-infectives (From admission, onward)   Start     Dose/Rate Route Frequency Ordered Stop   09/16/18 1630  ceFAZolin (ANCEF) IVPB 2g/100 mL premix     2 g 200 mL/hr over 30 Minutes Intravenous Every 6 hours 09/16/18 1330 09/16/18 2159   09/16/18 0815  ceFAZolin (ANCEF) IVPB 2g/100 mL premix     2 g 200 mL/hr over 30 Minutes Intravenous On call to O.R. 09/16/18 0803 09/16/18 1023     and started on DVT prophylaxis in the form of Xarelto.   PT and OT were ordered for total joint protocol.  Discharge planning consulted to help with postop disposition and equipment needs.  Patient had a difficult night on the evening of surgery due to pain.  They started to get up OOB with therapy on day one. Hemovac drain was pulled without difficulty.  Continued to work with therapy into day two.  Dressing was changed on day two and the incision was clean and dry. Progressed slowly with therapy. By day three, the patient had progressed with therapy and meeting their goals.  Incision was healing well.  Patient was seen in rounds and  was ready to go home.   Diet: Cardiac diet Activity:WBAT Follow-up:in 2 weeks Disposition - Home Discharged Condition: stable   Discharge Instructions    Call MD / Call 911   Complete by:  As directed    If you experience chest pain or shortness of breath, CALL 911 and be transported to the hospital emergency room.  If you develope a fever above 101 F, pus (white drainage) or increased drainage or redness at the wound, or calf pain, call your surgeon's office.   Change dressing   Complete by:  As directed    Change dressing on Wednesday, then change the dressing daily with sterile 4 x 4 inch gauze dressing and apply TED hose.   Constipation Prevention   Complete by:  As directed    Drink plenty of fluids.  Prune juice may be helpful.  You may use a stool softener, such as Colace (over the counter) 100 mg twice a day.  Use MiraLax (over the counter) for constipation as  needed.   Diet - low sodium heart healthy   Complete by:  As directed    Discharge instructions   Complete by:  As directed    Dr. Gaynelle Arabian Total Joint Specialist [facility] [address] [place], [place] [address] [phone]  TOTAL KNEE REPLACEMENT POSTOPERATIVE DIRECTIONS  Knee Rehabilitation, Guidelines Following Surgery  Results after knee surgery are often greatly improved when you follow the exercise, range of motion and muscle strengthening exercises prescribed by your doctor. Safety measures are also important to protect the knee from further injury. Any time any of these exercises cause you to have increased pain or swelling in your knee joint, decrease the amount until you are comfortable again and slowly increase them. If you have problems or questions, call your caregiver or physical therapist for advice.   HOME CARE INSTRUCTIONS  Remove items at home which could result in a fall. This includes throw rugs or furniture in walking pathways.  ICE to the affected knee every three hours for 30 minutes at a time and then as needed for pain and swelling.  Continue to use ice on the knee for pain and swelling from surgery. You may notice swelling that will progress down to the foot and ankle.  This is normal after surgery.  Elevate the leg when you are not up walking on it.   Continue to use the breathing machine which will help keep your temperature down.  It is common for your temperature to cycle up and down following surgery, especially at night when you are not up moving around and exerting yourself.  The breathing machine keeps your lungs expanded and your temperature down. Do not place pillow under knee, focus on keeping the knee straight while resting   DIET You may resume your previous home diet once your are discharged from the hospital.  DRESSING / WOUND CARE / SHOWERING You may shower 3 days after surgery, but keep the wounds dry during showering.  You  may use an occlusive plastic wrap (Press'n Seal for example), NO SOAKING/SUBMERGING IN THE BATHTUB.  If the bandage gets wet, change with a clean dry gauze.  If the incision gets wet, pat the wound dry with a clean towel. You may start showering once you are discharged home but do not submerge the incision under water. Just pat the incision dry and apply a dry gauze dressing on daily. Change the surgical dressing daily and reapply a dry dressing  each time.  ACTIVITY Walk with your walker as instructed. Use walker as long as suggested by your caregivers. Avoid periods of inactivity such as sitting longer than an hour when not asleep. This helps prevent blood clots.  You may resume a sexual relationship in one month or when given the OK by your doctor.  You may return to work once you are cleared by your doctor.  Do not drive a car for 6 weeks or until released by you surgeon.  Do not drive while taking narcotics.  WEIGHT BEARING Weight bearing as tolerated with assist device (walker, cane, etc) as directed, use it as long as suggested by your surgeon or therapist, typically at least 4-6 weeks.  POSTOPERATIVE CONSTIPATION PROTOCOL Constipation - defined medically as fewer than three stools per week and severe constipation as less than one stool per week.  One of the most common issues patients have following surgery is constipation.  Even if you have a regular bowel pattern at home, your normal regimen is likely to be disrupted due to multiple reasons following surgery.  Combination of anesthesia, postoperative narcotics, change in appetite and fluid intake all can affect your bowels.  In order to avoid complications following surgery, here are some recommendations in order to help you during your recovery period.  Colace (docusate) - Pick up an over-the-counter form of Colace or another stool softener and take twice a day as long as you are requiring postoperative pain medications.  Take with a  full glass of water daily.  If you experience loose stools or diarrhea, hold the colace until you stool forms back up.  If your symptoms do not get better within 1 week or if they get worse, check with your doctor.  Dulcolax (bisacodyl) - Pick up over-the-counter and take as directed by the product packaging as needed to assist with the movement of your bowels.  Take with a full glass of water.  Use this product as needed if not relieved by Colace only.   MiraLax (polyethylene glycol) - Pick up over-the-counter to have on hand.  MiraLax is a solution that will increase the amount of water in your bowels to assist with bowel movements.  Take as directed and can mix with a glass of water, juice, soda, coffee, or tea.  Take if you go more than two days without a movement. Do not use MiraLax more than once per day. Call your doctor if you are still constipated or irregular after using this medication for 7 days in a row.  If you continue to have problems with postoperative constipation, please contact the office for further assistance and recommendations.  If you experience "the worst abdominal pain ever" or develop nausea or vomiting, please contact the office immediatly for further recommendations for treatment.  ITCHING  If you experience itching with your medications, try taking only a single pain pill, or even half a pain pill at a time.  You can also use Benadryl over the counter for itching or also to help with sleep.   TED HOSE STOCKINGS Wear the elastic stockings on both legs for three weeks following surgery during the day but you may remove then at night for sleeping.  MEDICATIONS See your medication summary on the "After Visit Summary" that the nursing staff will review with you prior to discharge.  You may have some home medications which will be placed on hold until you complete the course of blood thinner medication.  It is important for you  to complete the blood thinner medication as  prescribed by your surgeon.  Continue your approved medications as instructed at time of discharge.  PRECAUTIONS If you experience chest pain or shortness of breath - call 911 immediately for transfer to the hospital emergency department.  If you develop a fever greater that 101 F, purulent drainage from wound, increased redness or drainage from wound, foul odor from the wound/dressing, or calf pain - CONTACT YOUR SURGEON.                                                   FOLLOW-UP APPOINTMENTS Make sure you keep all of your appointments after your operation with your surgeon and caregivers. You should call the office at the above phone number and make an appointment for approximately two weeks after the date of your surgery or on the date instructed by your surgeon outlined in the "After Visit Summary".   RANGE OF MOTION AND STRENGTHENING EXERCISES  Rehabilitation of the knee is important following a knee injury or an operation. After just a few days of immobilization, the muscles of the thigh which control the knee become weakened and shrink (atrophy). Knee exercises are designed to build up the tone and strength of the thigh muscles and to improve knee motion. Often times heat used for twenty to thirty minutes before working out will loosen up your tissues and help with improving the range of motion but do not use heat for the first two weeks following surgery. These exercises can be done on a training (exercise) mat, on the floor, on a table or on a bed. Use what ever works the best and is most comfortable for you Knee exercises include:  Leg Lifts - While your knee is still immobilized in a splint or cast, you can do straight leg raises. Lift the leg to 60 degrees, hold for 3 sec, and slowly lower the leg. Repeat 10-20 times 2-3 times daily. Perform this exercise against resistance later as your knee gets better.  Quad and Hamstring Sets - Tighten up the muscle on the front of the thigh (Quad) and  hold for 5-10 sec. Repeat this 10-20 times hourly. Hamstring sets are done by pushing the foot backward against an object and holding for 5-10 sec. Repeat as with quad sets.  Leg Slides: Lying on your back, slowly slide your foot toward your buttocks, bending your knee up off the floor (only go as far as is comfortable). Then slowly slide your foot back down until your leg is flat on the floor again. Angel Wings: Lying on your back spread your legs to the side as far apart as you can without causing discomfort.  A rehabilitation program following serious knee injuries can speed recovery and prevent re-injury in the future due to weakened muscles. Contact your doctor or a physical therapist for more information on knee rehabilitation.   IF YOU ARE TRANSFERRED TO A SKILLED REHAB FACILITY If the patient is transferred to a skilled rehab facility following release from the hospital, a list of the current medications will be sent to the facility for the patient to continue.  When discharged from the skilled rehab facility, please have the facility set up the patient's [place] prior to being released. Also, the skilled facility will be responsible for providing the patient with their  medications at time of release from the facility to include their pain medication, the muscle relaxants, and their blood thinner medication. If the patient is still at the rehab facility at time of the two week follow up appointment, the skilled rehab facility will also need to assist the patient in arranging follow up appointment in our office and any transportation needs.  MAKE SURE YOU:  Understand these instructions.  Get help right away if you are not doing well or get worse.    Pick up stool softner and laxative for home use following surgery while on pain medications. Do not submerge incision under water. Please use good hand washing techniques while changing dressing each day. May shower starting  three days after surgery. Please use a clean towel to pat the incision dry following showers. Continue to use ice for pain and swelling after surgery. Do not use any lotions or creams on the incision until instructed by your surgeon.   Do not put a pillow under the knee. Place it under the heel.   Complete by:  As directed    Driving restrictions   Complete by:  As directed    No driving for two weeks   TED hose   Complete by:  As directed    Use stockings (TED hose) for three weeks on both leg(s).  You may remove them at night for sleeping.   Weight bearing as tolerated   Complete by:  As directed      Allergies as of 09/19/2018      Reactions   Morphine And Related Other (See Comments)   Hallucinations      Medication List    STOP taking these medications   naproxen sodium 220 MG tablet Commonly known as:  ALEVE     TAKE these medications   atorvastatin 40 MG tablet Commonly known as:  LIPITOR Take 40 mg by mouth at bedtime.   DULoxetine 30 MG capsule Commonly known as:  CYMBALTA Take 60 mg by mouth daily.   gabapentin 300 MG capsule Commonly known as:  NEURONTIN Take 600 mg by mouth at bedtime.   methocarbamol 500 MG tablet Commonly known as:  ROBAXIN Take 1 tablet (500 mg total) by mouth every 6 (six) hours as needed for muscle spasms.   oxyCODONE 5 MG immediate release tablet Commonly known as:  Oxy IR/ROXICODONE Take 1 tablet (5 mg total) by mouth every 4 (four) hours as needed for breakthrough pain.   rivaroxaban 10 MG Tabs tablet Commonly known as:  XARELTO Take 1 tablet (10 mg total) by mouth daily for 21 days. Then take one 81 mg aspirin once a day for three weeks. Then discontinue aspirin.     ASK your doctor about these medications   NEOMYCIN-POLYMYXIN-HYDROCORTISONE 1 % Soln OTIC solution Commonly known as:  CORTISPORIN Place 4 drops into the left ear 2 (two) times daily. Take for 5 days starting on 09/11/18. Ask about: Should I take this  medication?            Discharge Care Instructions  (From admission, onward)         Start     Ordered   09/16/18 0000  Weight bearing as tolerated     09/16/18 1450   09/16/18 0000  Change dressing    Comments:  Change dressing on Wednesday, then change the dressing daily with sterile 4 x 4 inch gauze dressing and apply TED hose.   09/16/18 1450  Follow-up Information    Gaynelle Arabian, MD. Schedule an appointment as soon as possible for a visit on 10/01/2018.   Specialty:  Orthopedic Surgery Contact information: [address] [place] [address] [phone]        [facility] Follow up.   Specialty:  [place] Why:  physical therapy Contact information: [address] [place]  [address] [phone]           Signed: Ardeen Jourdain, PA-C Orthopaedic Surgery 09/23/2018, 12:51 PM

## 2018-10-02 DIAGNOSIS — Z87891 Personal history of nicotine dependence: Secondary | ICD-10-CM | POA: Diagnosis not present

## 2018-10-02 DIAGNOSIS — E785 Hyperlipidemia, unspecified: Secondary | ICD-10-CM | POA: Diagnosis not present

## 2018-10-02 DIAGNOSIS — G629 Polyneuropathy, unspecified: Secondary | ICD-10-CM | POA: Diagnosis not present

## 2018-10-02 DIAGNOSIS — Z9181 History of falling: Secondary | ICD-10-CM | POA: Diagnosis not present

## 2018-10-02 DIAGNOSIS — Z7901 Long term (current) use of anticoagulants: Secondary | ICD-10-CM | POA: Diagnosis not present

## 2018-10-02 DIAGNOSIS — Z8546 Personal history of malignant neoplasm of prostate: Secondary | ICD-10-CM | POA: Diagnosis not present

## 2018-10-02 DIAGNOSIS — Z471 Aftercare following joint replacement surgery: Secondary | ICD-10-CM | POA: Diagnosis not present

## 2018-10-02 DIAGNOSIS — I10 Essential (primary) hypertension: Secondary | ICD-10-CM | POA: Diagnosis not present

## 2018-10-02 DIAGNOSIS — Z96651 Presence of right artificial knee joint: Secondary | ICD-10-CM | POA: Diagnosis not present

## 2018-10-02 DIAGNOSIS — I2510 Atherosclerotic heart disease of native coronary artery without angina pectoris: Secondary | ICD-10-CM | POA: Diagnosis not present

## 2018-10-02 DIAGNOSIS — K219 Gastro-esophageal reflux disease without esophagitis: Secondary | ICD-10-CM | POA: Diagnosis not present

## 2018-10-03 ENCOUNTER — Ambulatory Visit: Payer: PPO | Attending: Orthopedic Surgery | Admitting: Physical Therapy

## 2018-10-03 DIAGNOSIS — R262 Difficulty in walking, not elsewhere classified: Secondary | ICD-10-CM | POA: Insufficient documentation

## 2018-10-03 DIAGNOSIS — R600 Localized edema: Secondary | ICD-10-CM | POA: Insufficient documentation

## 2018-10-03 DIAGNOSIS — M25661 Stiffness of right knee, not elsewhere classified: Secondary | ICD-10-CM | POA: Diagnosis not present

## 2018-10-03 DIAGNOSIS — M6281 Muscle weakness (generalized): Secondary | ICD-10-CM

## 2018-10-03 DIAGNOSIS — G8929 Other chronic pain: Secondary | ICD-10-CM | POA: Insufficient documentation

## 2018-10-03 DIAGNOSIS — R2689 Other abnormalities of gait and mobility: Secondary | ICD-10-CM | POA: Diagnosis not present

## 2018-10-03 DIAGNOSIS — M25561 Pain in right knee: Secondary | ICD-10-CM | POA: Insufficient documentation

## 2018-10-03 NOTE — Therapy (Signed)
[facility] [address]  [place], [place], [address] Phone: [phone]   Fax:  [phone]  Physical Therapy Evaluation  Patient Details  Name: Bradley Spencer MRN: [id number] Date of Birth: Nov 18, 1940 Referring Provider (PT): Gaynelle Arabian, MD   Encounter Date: 10/03/2018  PT End of Session - 10/03/18 1445    Visit Number  1    Number of Visits  8    Date for PT Re-Evaluation  10/31/18    PT Start Time  4128    PT Stop Time  1534    PT Time Calculation (min)  49 min    Activity Tolerance  Patient tolerated treatment well    Behavior During Therapy  [facility] for tasks assessed/performed   Pt having difficulty focusing on questions/tasks at times      Past Medical History:  Diagnosis Date  . Abnormal EKG    1 degree av block   . Arthritis   . CAD (coronary artery disease)   . Cancer [facility])    prostate   . Dyslipidemia   . Ear infection 09/11/2018   reports the doctor started him on 5 day abx ear drop course  today   . GERD (gastroesophageal reflux disease)   . History of hiatal hernia   . History of kidney stones   . Hypertension    no meds   . Lower leg edema    bilateral   . Neuropathy   . Pre-diabetes    per patient   . SOB (shortness of breath)     Past Surgical History:  Procedure Laterality Date  . APPENDECTOMY     possibly took with colectomy   . CARDIAC CATHETERIZATION  11/27/2016   [facility]   . [place]  04/2018   fusion    . COLECTOMY     with colostomy placement and subsequent  colostomy reversal   . HIP ARTHROPLASTY Left 2014  . LUMBAR SPINE SURGERY     3 surgeries in the 90s  . PENILE PROSTHESIS IMPLANT     s/p prostatectomy   . PERCUTANEOUS CORONARY STENT INTERVENTION (PCI-S)  2015  . PROSTATECTOMY  2003  . TONSILLECTOMY    . TOTAL KNEE ARTHROPLASTY Right 09/16/2018   Procedure: TOTAL KNEE ARTHROPLASTY;  Surgeon: Gaynelle Arabian, MD;  Location: [facility];  Service: Orthopedics;  Laterality: Right;  1min    There were no vitals filed for this visit.   Subjective Assessment - 10/03/18 1450    Subjective  Pt s/p R TKR with 2 day post-op stay with [facility] PT upon discharge finishing yesterday.  Feels like his greatest weakness at this point is balance and states he feels like his knee may give way still.    Pertinent History  R TKR 09/16/18    Limitations  Sitting;Standing;Walking;House hold activities    How long can you sit comfortably?  1 hr    How long can you stand comfortably?  10 minutes    How long can you walk comfortably?  10 minutes    Patient Stated Goals  "feels better balanced with everyday activites"    Currently in Pain?  Yes    Pain Score  2     Pain Location  Knee    Pain Orientation  Right    Pain Descriptors / Indicators  Dull;Throbbing;Constant    Pain Type  Surgical pain    Pain Radiating Towards  up into  anterior & posterior thigh as well as posterior calf at time    Pain Onset  1 to 4 weeks ago    Pain Frequency  Intermittent    Aggravating Factors   exercises, stairs    Pain Relieving Factors  sleep, prescription pain meds, ice         [facility] PT Assessment - 10/03/18 1445      Assessment   Medical Diagnosis  R TKR    Referring Provider (PT)  Gaynelle Arabian, MD    Onset Date/Surgical Date  09/16/18    Next MD Visit  uncertain     Prior Therapy  [facility] PT since surgery      Balance Screen   Has the patient fallen in the past 6 months  No    Has the patient had a decrease in activity level because of a fear of falling?   No    Is the patient reluctant to leave their home because of a fear of falling?   No      Home Environment   Living Environment  Private residence    Type of [place]  Two level   split level   Alternate Level Stairs-Number of Steps  8 per [place] - 2 wheels;Cane - single point;Bedside commode;Shower seat;Grab bars - tub/shower      Prior Function    Level of Independence  Independent    Vocation  Retired    Leisure  a lot of time on computer - mostly sedentary      Cognition   Overall Cognitive Status  Within Functional Limits for tasks assessed      Observation/Other Assessments   Focus on Therapeutic Outcomes (FOTO)   Knee - 48% (52% limitation); Predicted 61% 39%      ROM / Strength   AROM / PROM / Strength  AROM;PROM;Strength      AROM   AROM Assessment Site  Knee    Right/Left Knee  Right;Left    Right Knee Extension  6    Right Knee Flexion  102    Left Knee Extension  0    Left Knee Flexion  123      PROM   PROM Assessment Site  Knee    Right/Left Knee  Right    Right Knee Extension  4    Right Knee Flexion  109      Strength   Strength Assessment Site  Hip;Knee    Right/Left Hip  Right;Left    Right Hip Flexion  4/5    Right Hip Extension  4-/5    Right Hip ABduction  4/5    Right Hip ADduction  3-/5    Left Hip Flexion  4/5    Left Hip Extension  4-/5    Left Hip ABduction  4/5    Left Hip ADduction  4-/5    Right/Left Knee  Right;Left    Right Knee Flexion  4/5    Right Knee Extension  4/5    Left Knee Flexion  4-/5    Left Knee Extension  4/5      Flexibility   Soft Tissue Assessment /Muscle Length  yes    Hamstrings  mild tight     Quadriceps  mod tight quads with severely tight hip flexors      Ambulation/Gait   Ambulation/Gait  Yes    Ambulation/Gait Assistance  6: Modified independent (Device/Increase  time)    Ambulation Distance (Feet)  80 Feet    Assistive device  Straight cane   on R   Gait Pattern  Step-through pattern;Wide base of support;Decreased stride length;Decreased hip/knee flexion - right;Decreased hip/knee flexion - left;Decreased weight shift to right    Ambulation Surface  Level;Indoor                Objective measurements completed on examination: See above findings.      [facility] PT Treatment/Exercise - 10/03/18 1445      Exercises   Exercises   Knee/Hip      Knee/Hip Exercises: Stretches   Passive Hamstring Stretch  Right;30 seconds;2 reps    Passive Hamstring Stretch Limitations  + gastroc, supine with strap      Knee/Hip Exercises: Supine   Short Arc Quad Sets  Right;5 reps;AROM    Bridges  Both;5 reps;Strengthening             PT Education - 10/03/18 1534    Education Details  PT eval findings, anticipated POC, brief review of HH HEP & instruction in additonal HEP exercises    Person(s) Educated  Patient    Methods  Explanation;Demonstration;Handout    Comprehension  Verbalized understanding;Returned demonstration;Need further instruction          PT Long Term Goals - 10/03/18 1550      PT LONG TERM GOAL #1   Title  Independent with ongoing HEP    Status  New    Target Date  10/31/18      PT LONG TERM GOAL #2   Title  R knee AROM >/= 2-120 dg to allow for normal gait and stair mechanics    Status  New    Target Date  10/31/18      PT LONG TERM GOAL #3   Title  R hip and knee strength >/= 4+/5 for improved stability    Status  New    Target Date  10/31/18      PT LONG TERM GOAL #4   Title  Patient will ambulate with normal gait pattern w/o AD to allow for normal community access    Status  New    Target Date  10/31/18      PT LONG TERM GOAL #5   Title  Patient will negotiate stairs with good reciprocal pattern w/o evidence of R knee instability to allow safe access to all levels of home    Status  New    Target Date  10/31/18             Plan - 10/03/18 1547    Clinical Impression Statement  Bradley Spencer is a 78 y/o male who presents to OP PT 17 days s/p R TKR on 09/16/18. Pt received HH PT x 2 weeks post-op, completed yesterday. He arrives to PT ambulating with SPC on R with mild antalgic gait pattern favoring R LE with decreased stride length, decreased hip and knee flexion and decreased weight shift to R - he is aware that the [facility] should be used on the L, but states he is unable to coordinate  the step pattern that way. Assessment reveals decreased ROM of R knee (currently 6-102 dg AROM and 4-109 PROM), mild decreased proximal LE flexibility with decreased R patellar mobility in all planes, decreased incisional scar mobility, moderate LE edema and mild to moderate B hip and R knee weakness with mild R quad lag. Bradley Spencer will benefit from skilled PT intervention to address  the above listed deficits to allow for improved balance and gait to maximize function and mobility.    History and Personal Factors relevant to plan of care:  h/o multiple back & neck surgeries, L THR, obesity    Clinical Presentation  Stable    Clinical Decision Making  Low    Rehab Potential  Good    Clinical Impairments Affecting Rehab Potential  h/o multiple back & neck surgeries, L THR, obesity    PT Frequency  2x / week    PT Duration  4 weeks    PT Treatment/Interventions  Patient/family education;Therapeutic exercise;Therapeutic activities;Functional mobility training;Gait training;Stair training;Balance training;Neuromuscular re-education;Manual techniques;Scar mobilization;Passive range of motion;Dry needling;Taping;Electrical Stimulation;Cryotherapy;Vasopneumatic Device;ADLs/Self Care Home Management    PT Next Visit Plan  R knee ROM & strengthening, gait training with proper use of SPC, balance    Consulted and Agree with Plan of Care  Patient       Patient will benefit from skilled therapeutic intervention in order to improve the following deficits and impairments:  Pain, Decreased range of motion, Impaired flexibility, Decreased strength, Difficulty walking, Abnormal gait, Decreased safety awareness, Decreased balance, Decreased activity tolerance, Decreased scar mobility  Visit Diagnosis: Chronic pain of right knee  Muscle weakness (generalized)  Stiffness of right knee, not elsewhere classified  Difficulty in walking, not elsewhere classified  Other abnormalities of gait and mobility  Localized  edema     Problem List Patient Active Problem List   Diagnosis Date Noted  . OA (osteoarthritis) of knee 09/16/2018  . Osteoarthritis of right knee 09/16/2018    Bradley Spencer, PT, MPT 10/03/2018, 4:05 PM  [facility] [address]  [place], [place], [address] Phone: [phone]   Fax:  [phone]  Name: Bradley Spencer MRN: [id number] Date of Birth: 11-Nov-1940

## 2018-10-08 ENCOUNTER — Ambulatory Visit: Payer: PPO | Admitting: Physical Therapy

## 2018-10-08 ENCOUNTER — Encounter: Payer: Self-pay | Admitting: Physical Therapy

## 2018-10-08 DIAGNOSIS — G8929 Other chronic pain: Secondary | ICD-10-CM

## 2018-10-08 DIAGNOSIS — M6281 Muscle weakness (generalized): Secondary | ICD-10-CM

## 2018-10-08 DIAGNOSIS — M25661 Stiffness of right knee, not elsewhere classified: Secondary | ICD-10-CM

## 2018-10-08 DIAGNOSIS — M25561 Pain in right knee: Secondary | ICD-10-CM | POA: Diagnosis not present

## 2018-10-08 DIAGNOSIS — R262 Difficulty in walking, not elsewhere classified: Secondary | ICD-10-CM

## 2018-10-08 NOTE — Therapy (Signed)
[facility] [place], [place], [address] Phone: [phone]   Fax:  [phone]  Physical Therapy Treatment  Patient Details  Name: Bradley Spencer MRN: [id number] Date of Birth: 1941/05/26 Referring Provider (PT): Gaynelle Arabian, MD   Encounter Date: 10/08/2018  PT End of Session - 10/08/18 1558    Visit Number  2    Date for PT Re-Evaluation  10/31/18    PT Start Time  7824    PT Stop Time  1613    PT Time Calculation (min)  58 min    Activity Tolerance  Patient tolerated treatment well    Behavior During Therapy  [facility] for tasks assessed/performed       Past Medical History:  Diagnosis Date  . Abnormal EKG    1 degree av block   . Arthritis   . CAD (coronary artery disease)   . Cancer [facility])    prostate   . Dyslipidemia   . Ear infection 09/11/2018   reports the doctor started him on 5 day abx ear drop course  today   . GERD (gastroesophageal reflux disease)   . History of hiatal hernia   . History of kidney stones   . Hypertension    no meds   . Lower leg edema    bilateral   . Neuropathy   . Pre-diabetes    per patient   . SOB (shortness of breath)     Past Surgical History:  Procedure Laterality Date  . APPENDECTOMY     possibly took with colectomy   . CARDIAC CATHETERIZATION  11/27/2016   [facility]   . [place]  04/2018   fusion    . COLECTOMY     with colostomy placement and subsequent  colostomy reversal   . HIP ARTHROPLASTY Left 2014  . LUMBAR SPINE SURGERY     3 surgeries in the 90s  . PENILE PROSTHESIS IMPLANT     s/p prostatectomy   . PERCUTANEOUS CORONARY STENT INTERVENTION (PCI-S)  2015  . PROSTATECTOMY  2003  . TONSILLECTOMY    . TOTAL KNEE ARTHROPLASTY Right 09/16/2018   Procedure: TOTAL KNEE ARTHROPLASTY;  Surgeon: Gaynelle Arabian, MD;  Location: [facility];  Service: Orthopedics;  Laterality: Right;  79min    There were no vitals filed  for this visit.  Subjective Assessment - 10/08/18 1517    Subjective  Pt ambulate in clinic with SPN in wrong [place]. Reports that he is doing fine    Pain Score  2     Pain Location  Knee    Pain Orientation  Right                       [facility] Adult PT Treatment/Exercise - 10/08/18 0001      High Level Balance   High Level Balance Activities  Side stepping      Knee/Hip Exercises: Aerobic   Recumbent Bike  x 3 min    Nustep  L4 x 6 min       Knee/Hip Exercises: Machines for Strengthening   Cybex Leg Press  20lb x15, RLE TKE 20lb 2x5      Knee/Hip Exercises: Seated   Long Arc Quad  Right;2 sets;10 reps;Weights    Long Arc Quad Weight  5 lbs.    Hamstring Curl  Strengthening;Right;2 sets;15 reps    Hamstring Limitations  Blue tband  Modalities   Modalities  Vasopneumatic      Vasopneumatic   Number Minutes Vasopneumatic   15 minutes    Vasopnuematic Location   Knee    Vasopneumatic Pressure  Medium    Vasopneumatic Temperature   32      Manual Therapy   Manual Therapy  Passive ROM    Passive ROM  R knee flex and ext                   PT Long Term Goals - 10/03/18 1550      PT LONG TERM GOAL #1   Title  Independent with ongoing HEP    Status  New    Target Date  10/31/18      PT LONG TERM GOAL #2   Title  R knee AROM >/= 2-120 dg to allow for normal gait and stair mechanics    Status  New    Target Date  10/31/18      PT LONG TERM GOAL #3   Title  R hip and knee strength >/= 4+/5 for improved stability    Status  New    Target Date  10/31/18      PT LONG TERM GOAL #4   Title  Patient will ambulate with normal gait pattern w/o AD to allow for normal community access    Status  New    Target Date  10/31/18      PT LONG TERM GOAL #5   Title  Patient will negotiate stairs with good reciprocal pattern w/o evidence of R knee instability to allow safe access to all levels of home    Status  New    Target Date  10/31/18             Plan - 10/08/18 1559    Clinical Impression Statement  Bradley Spencer tolerated an initial progression toe TE well. HE gives good effort with all interventions and like to push himself. Some pain initially reported on recumbent bike but that went away after he go warm up. Restricted R knee patella mobility in all planes noted with PROM. Pt also with decrease distal incisional scar mobility. Some warmth noted in anterior R knee. Pt again reports that's he could not coordinate SPC in LUE, despite multiple cues.      Rehab Potential  Good    Clinical Impairments Affecting Rehab Potential  h/o multiple back & neck surgeries, L THR, obesity    PT Treatment/Interventions  Patient/family education;Therapeutic exercise;Therapeutic activities;Functional mobility training;Gait training;Stair training;Balance training;Neuromuscular re-education;Manual techniques;Scar mobilization;Passive range of motion;Dry needling;Taping;Electrical Stimulation;Cryotherapy;Vasopneumatic Device;ADLs/Self Care Home Management    PT Next Visit Plan  R knee ROM & strengthening, gait training with proper use of SPC, balance       Patient will benefit from skilled therapeutic intervention in order to improve the following deficits and impairments:  Pain, Decreased range of motion, Impaired flexibility, Decreased strength, Difficulty walking, Abnormal gait, Decreased safety awareness, Decreased balance, Decreased activity tolerance, Decreased scar mobility  Visit Diagnosis: Chronic pain of right knee  Muscle weakness (generalized)  Stiffness of right knee, not elsewhere classified  Difficulty in walking, not elsewhere classified     Problem List Patient Active Problem List   Diagnosis Date Noted  . OA (osteoarthritis) of knee 09/16/2018  . Osteoarthritis of right knee 09/16/2018    Scot Jun, PTA 10/08/2018, 4:05 PM  [place], [place],  [address] Phone: [phone]   Fax:  [phone]  Name: Bradley Spencer MRN: [id number] Date of Birth: [birth year]

## 2018-10-10 ENCOUNTER — Ambulatory Visit: Payer: PPO | Admitting: Physical Therapy

## 2018-10-10 DIAGNOSIS — M6281 Muscle weakness (generalized): Secondary | ICD-10-CM

## 2018-10-10 DIAGNOSIS — G8929 Other chronic pain: Secondary | ICD-10-CM

## 2018-10-10 DIAGNOSIS — M25561 Pain in right knee: Secondary | ICD-10-CM | POA: Diagnosis not present

## 2018-10-10 DIAGNOSIS — M25661 Stiffness of right knee, not elsewhere classified: Secondary | ICD-10-CM

## 2018-10-10 DIAGNOSIS — R262 Difficulty in walking, not elsewhere classified: Secondary | ICD-10-CM

## 2018-10-10 NOTE — Therapy (Signed)
Bradley Spencer, [place], [address] Phone: [phone]   Fax:  [phone]  Physical Therapy Treatment  Patient Details  Name: Bradley Spencer MRN: [id number] Date of Birth: 1941-03-07 Referring Provider (PT): Gaynelle Arabian, MD   Encounter Date: 10/10/2018  PT End of Session - 10/10/18 1350    Visit Number  3    Number of Visits  8    Date for PT Re-Evaluation  10/31/18    PT Start Time  3557    PT Stop Time  1441    PT Time Calculation (min)  52 min    Activity Tolerance  Patient tolerated treatment well    Behavior During Therapy  [facility] for tasks assessed/performed       Past Medical History:  Diagnosis Date  . Abnormal EKG    1 degree av block   . Arthritis   . CAD (coronary artery disease)   . Cancer [facility])    prostate   . Dyslipidemia   . Ear infection 09/11/2018   reports the doctor started him on 5 day abx ear drop course  today   . GERD (gastroesophageal reflux disease)   . History of hiatal hernia   . History of kidney stones   . Hypertension    no meds   . Lower leg edema    bilateral   . Neuropathy   . Pre-diabetes    per patient   . SOB (shortness of breath)     Past Surgical History:  Procedure Laterality Date  . APPENDECTOMY     possibly took with colectomy   . CARDIAC CATHETERIZATION  11/27/2016   [facility]   . [place]  04/2018   fusion    . COLECTOMY     with colostomy placement and subsequent  colostomy reversal   . HIP ARTHROPLASTY Left 2014  . LUMBAR SPINE SURGERY     3 surgeries in the 90s  . PENILE PROSTHESIS IMPLANT     s/p prostatectomy   . PERCUTANEOUS CORONARY STENT INTERVENTION (PCI-S)  2015  . PROSTATECTOMY  2003  . TONSILLECTOMY    . TOTAL KNEE ARTHROPLASTY Right 09/16/2018   Procedure: TOTAL KNEE ARTHROPLASTY;  Surgeon: Gaynelle Arabian, MD;  Location: [facility];  Service: Orthopedics;  Laterality: Right;  38min    There  were no vitals filed for this visit.  Subjective Assessment - 10/10/18 1350    Subjective  Patient reports maked pain after last treatment. Didn't feel better until this morning. He states he doesn't want to do the ice today.    Pertinent History  R TKR 09/16/18    Limitations  Sitting;Standing;Walking;House hold activities    How long can you sit comfortably?  1 hr    How long can you stand comfortably?  10 minutes    How long can you walk comfortably?  10 minutes    Patient Stated Goals  "feels better balanced with everyday activites"    Currently in Pain?  Yes    Pain Score  2     Pain Location  Knee    Pain Orientation  Right    Pain Descriptors / Indicators  Dull;Constant    Pain Type  Surgical pain                       [facility] Adult PT Treatment/Exercise - 10/10/18 0001      Knee/Hip  Exercises: Aerobic   Recumbent Bike  x 3 min    Nustep  L4 x 6 min    L4 x 6 min moving up to seat 11     Knee/Hip Exercises: Standing   Heel Raises  Both;10 reps;1 set    Knee Flexion  AROM;Both;15 reps    Knee Flexion Limitations  on step for stretch    Lateral Step Up  Right;1 set;10 reps;Hand Hold: 1;Step Height: 4"    Forward Step Up  Right;10 reps;Hand Hold: 1;Step Height: 4"    Rocker Board  2 minutes    Gait Training  semi tandem stance Right leg back with left ankle DF x 10      Knee/Hip Exercises: Seated   Other Seated Knee/Hip Exercises  toe raises x 10      Modalities   Modalities  Electrical Stimulation      Electrical Stimulation   Electrical Stimulation Location  Right knee    Electrical Stimulation Action  IFC    Electrical Stimulation Parameters  supine    Electrical Stimulation Goals  Pain;Edema             PT Education - 10/10/18 1528    Education Details  prone knee hang for extension; lunge on step for flexion    Person(s) Educated  Patient    Methods  Explanation;Demonstration;Verbal cues    Comprehension  Verbalized  understanding;Returned demonstration          PT Long Term Goals - 10/03/18 1550      PT LONG TERM GOAL #1   Title  Independent with ongoing HEP    Status  New    Target Date  10/31/18      PT LONG TERM GOAL #2   Title  R knee AROM >/= 2-120 dg to allow for normal gait and stair mechanics    Status  New    Target Date  10/31/18      PT LONG TERM GOAL #3   Title  R hip and knee strength >/= 4+/5 for improved stability    Status  New    Target Date  10/31/18      PT LONG TERM GOAL #4   Title  Patient will ambulate with normal gait pattern w/o AD to allow for normal community access    Status  New    Target Date  10/31/18      PT LONG TERM GOAL #5   Title  Patient will negotiate stairs with good reciprocal pattern w/o evidence of R knee instability to allow safe access to all levels of home    Status  New    Target Date  10/31/18            Plan - 10/10/18 1431    Clinical Impression Statement  Patient tolerated TE fairly well today. We decreased intensity due to flare up after last visit. He fatigues easily with standing exercises. Normal response to estim for pain and edema. LTGs are ongoing.    PT Frequency  2x / week    PT Duration  4 weeks    PT Treatment/Interventions  Patient/family education;Therapeutic exercise;Therapeutic activities;Functional mobility training;Gait training;Stair training;Balance training;Neuromuscular re-education;Manual techniques;Scar mobilization;Passive range of motion;Dry needling;Taping;Electrical Stimulation;Cryotherapy;Vasopneumatic Device;ADLs/Self Care Home Management    PT Next Visit Plan  R knee ROM & strengthening, gait training with proper use of SPC, balance    Consulted and Agree with Plan of Care  Patient       Patient  will benefit from skilled therapeutic intervention in order to improve the following deficits and impairments:  Pain, Decreased range of motion, Impaired flexibility, Decreased strength, Difficulty walking,  Abnormal gait, Decreased safety awareness, Decreased balance, Decreased activity tolerance, Decreased scar mobility  Visit Diagnosis: Chronic pain of right knee  Muscle weakness (generalized)  Stiffness of right knee, not elsewhere classified  Difficulty in walking, not elsewhere classified     Problem List Patient Active Problem List   Diagnosis Date Noted  . OA (osteoarthritis) of knee 09/16/2018  . Osteoarthritis of right knee 09/16/2018    Madelyn Flavors PT 10/10/2018, 3:29 PM  [place], [place], [address] Phone: [phone]   Fax:  [phone]  Name: Bradley Spencer MRN: [id number] Date of Birth: 07-May-1941

## 2018-10-14 ENCOUNTER — Ambulatory Visit: Payer: PPO | Admitting: Physical Therapy

## 2018-10-14 DIAGNOSIS — M25661 Stiffness of right knee, not elsewhere classified: Secondary | ICD-10-CM

## 2018-10-14 DIAGNOSIS — M6281 Muscle weakness (generalized): Secondary | ICD-10-CM

## 2018-10-14 DIAGNOSIS — M25561 Pain in right knee: Principal | ICD-10-CM

## 2018-10-14 DIAGNOSIS — G8929 Other chronic pain: Secondary | ICD-10-CM

## 2018-10-14 NOTE — Therapy (Signed)
[place], [place], [address] Phone: [phone]   Fax:  [phone]  Physical Therapy Treatment  Patient Details  Name: Bradley Spencer MRN: [id number] Date of Birth: Aug 27, 1941 Referring Provider (PT): Gaynelle Arabian, MD   Encounter Date: 10/14/2018  PT End of Session - 10/14/18 1439    Visit Number  4    Number of Visits  8    Date for PT Re-Evaluation  10/31/18    PT Start Time  5366    PT Stop Time  1444    PT Time Calculation (min)  57 min    Activity Tolerance  Patient tolerated treatment well;Patient limited by fatigue    Behavior During Therapy  [facility] for tasks assessed/performed       Past Medical History:  Diagnosis Date  . Abnormal EKG    1 degree av block   . Arthritis   . CAD (coronary artery disease)   . Cancer [facility])    prostate   . Dyslipidemia   . Ear infection 09/11/2018   reports the doctor started him on 5 day abx ear drop course  today   . GERD (gastroesophageal reflux disease)   . History of hiatal hernia   . History of kidney stones   . Hypertension    no meds   . Lower leg edema    bilateral   . Neuropathy   . Pre-diabetes    per patient   . SOB (shortness of breath)     Past Surgical History:  Procedure Laterality Date  . APPENDECTOMY     possibly took with colectomy   . CARDIAC CATHETERIZATION  11/27/2016   [facility]   . [place]  04/2018   fusion    . COLECTOMY     with colostomy placement and subsequent  colostomy reversal   . HIP ARTHROPLASTY Left 2014  . LUMBAR SPINE SURGERY     3 surgeries in the 90s  . PENILE PROSTHESIS IMPLANT     s/p prostatectomy   . PERCUTANEOUS CORONARY STENT INTERVENTION (PCI-S)  2015  . PROSTATECTOMY  2003  . TONSILLECTOMY    . TOTAL KNEE ARTHROPLASTY Right 09/16/2018   Procedure: TOTAL KNEE ARTHROPLASTY;  Surgeon: Gaynelle Arabian, MD;  [facility]: [facility];  Service: Orthopedics;   Laterality: Right;  32min    There were no vitals filed for this visit.  Subjective Assessment - 10/14/18 1423    Subjective  Pt reports no new changes since last visit, "I didn't do my PT stuff like I was supposed to over the weekend". Pt reports he'd like to work on his balance.     Patient Stated Goals  "feels better balanced with everyday activites"    Currently in Pain?  Yes    Pain Score  2     Pain Orientation  Right Knee        [facility] PT Assessment - 10/14/18 0001      Assessment   Medical Diagnosis  R TKR    Referring Provider (PT)  Gaynelle Arabian, MD    Onset Date/Surgical Date  09/16/18    Next MD Visit  10/22/18    Prior Therapy  [facility] PT since surgery      AROM   Right/Left Knee  Right    Right Knee Extension  10    Right Knee Flexion  117        [facility] Adult  PT Treatment/Exercise - 10/14/18 0001      Self-Care   Self-Care  Other Self-Care Comments    Other Self-Care Comments   pt educated on scar massage; pt returned demo with cues.       Knee/Hip Exercises: Stretches   Passive Hamstring Stretch  Right;2 reps;30 seconds      Knee/Hip Exercises: Aerobic   Recumbent Bike  x 5 min for ROM    Other Aerobic  laps around gym (with SPC) to decrease stiffness between ROM exercises.       Knee/Hip Exercises: Standing   Heel Raises  Both;1 set;10 reps   and toe raises.    Lateral Step Up  Right;1 set;5 reps;Hand Hold: 1;Step Height: 4"    Lateral Step Up Limitations  fatigued quickly    Forward Step Up  Right;1 set;10 reps;Hand Hold: 1;Step Height: 4"    Forward Step Up Limitations  6" step too difficult    Functional Squat  1 set;10 reps    Other Standing Knee Exercises  semi tandem stance with each foot forward x 20 sec x 2 reps, occasional UE support to steady.    unable to tolerate tandem stance     Knee/Hip Exercises: Seated   Other Seated Knee/Hip Exercises  seated scoots x 10 sec x 5 reps       Electrical Stimulation   Electrical Stimulation Location  Rt  ant knee    Electrical Stimulation Action  IFC    Electrical Stimulation Parameters  to tolerance; pt in supported supine    Electrical Stimulation Goals  Pain      Vasopneumatic   Number Minutes Vasopneumatic   --   pt declined        PT Long Term Goals - 10/03/18 1550      PT LONG TERM GOAL #1   Title  Independent with ongoing HEP    Status  New    Target Date  10/31/18      PT LONG TERM GOAL #2   Title  R knee AROM >/= 2-120 dg to allow for normal gait and stair mechanics    Status  New    Target Date  10/31/18      PT LONG TERM GOAL #3   Title  R hip and knee strength >/= 4+/5 for improved stability    Status  New    Target Date  10/31/18      PT LONG TERM GOAL #4   Title  Patient will ambulate with normal gait pattern w/o AD to allow for normal community access    Status  New    Target Date  10/31/18      PT LONG TERM GOAL #5   Title  Patient will negotiate stairs with good reciprocal pattern w/o evidence of R knee instability to allow safe access to all levels of home    Status  New    Target Date  10/31/18            Plan - 10/14/18 1434    Clinical Impression Statement  Pt demonstrated improved Rt knee flexion ROM; extension continues to be limited.  Pt fatigues quickly with standing exercises; required short seated rest breaks.  Pt requested no ice to his knee at end of therapy, but did request estim for pain reduction. Progressing gradually towards goals.      Rehab Potential  Good    Clinical Impairments Affecting Rehab Potential  h/o multiple back & neck surgeries, L  THR, obesity    PT Frequency  2x / week    PT Duration  4 weeks    PT Treatment/Interventions  Patient/family education;Therapeutic exercise;Therapeutic activities;Functional mobility training;Gait training;Stair training;Balance training;Neuromuscular re-education;Manual techniques;Scar mobilization;Passive range of motion;Dry needling;Taping;Electrical  Stimulation;Cryotherapy;Vasopneumatic Device;ADLs/Self Care Home Management    PT Next Visit Plan  continue progressive ROM, strengthening, and balance training.  MD note: upcoming visit 10/22/18 (per pt).     Consulted and Agree with Plan of Care  Patient       Patient will benefit from skilled therapeutic intervention in order to improve the following deficits and impairments:  Pain, Decreased range of motion, Impaired flexibility, Decreased strength, Difficulty walking, Abnormal gait, Decreased safety awareness, Decreased balance, Decreased activity tolerance, Decreased scar mobility  Visit Diagnosis: Chronic pain of right knee  Muscle weakness (generalized)  Stiffness of right knee, not elsewhere classified     Problem List Patient Active Problem List   Diagnosis Date Noted  . OA (osteoarthritis) of knee 09/16/2018  . Osteoarthritis of right knee 09/16/2018   Kerin Perna, PTA 10/14/18 2:58 PM  [place], [place], [address] Phone: [phone]   Fax:  [phone]  Name: Bradley Spencer MRN: [id number] Date of Birth: 09-Apr-1941

## 2018-10-14 NOTE — Patient Instructions (Signed)
HIP: Hamstrings - Short Sitting    Rest leg on raised surface. Keep knee straight. Lift chest. Hold _30__ seconds. __2_ reps per set, _2__ sets per day, _7__ days per week  Copyright  V[employer]. All rights reserved.  Sitting Scoot    Scoot to the front of chair, then scoot back. Repeat __5__ times. Hold 10 seconds.  Do __2__ sessions per day.

## 2018-10-17 ENCOUNTER — Ambulatory Visit: Payer: PPO | Admitting: Physical Therapy

## 2018-10-17 ENCOUNTER — Encounter: Payer: Self-pay | Admitting: Physical Therapy

## 2018-10-17 DIAGNOSIS — G8929 Other chronic pain: Secondary | ICD-10-CM

## 2018-10-17 DIAGNOSIS — M25561 Pain in right knee: Secondary | ICD-10-CM | POA: Diagnosis not present

## 2018-10-17 DIAGNOSIS — M25661 Stiffness of right knee, not elsewhere classified: Secondary | ICD-10-CM

## 2018-10-17 DIAGNOSIS — M6281 Muscle weakness (generalized): Secondary | ICD-10-CM

## 2018-10-17 DIAGNOSIS — R600 Localized edema: Secondary | ICD-10-CM

## 2018-10-17 DIAGNOSIS — R262 Difficulty in walking, not elsewhere classified: Secondary | ICD-10-CM

## 2018-10-17 NOTE — Therapy (Signed)
[place], [place], [address] Phone: [phone]   Fax:  [phone]  Physical Therapy Treatment  Patient Details  Name: ROGELIO WAYNICK MRN: [id number] Date of Birth: 05-09-1941 Referring Provider (PT): Gaynelle Arabian, MD   Encounter Date: 10/17/2018  PT End of Session - 10/17/18 1603    Visit Number  5    Date for PT Re-Evaluation  10/31/18    PT Start Time  7106    PT Stop Time  1606    PT Time Calculation (min)  51 min    Activity Tolerance  Patient tolerated treatment well;Patient limited by fatigue    Behavior During Therapy  [facility] for tasks assessed/performed       Past Medical History:  Diagnosis Date  . Abnormal EKG    1 degree av block   . Arthritis   . CAD (coronary artery disease)   . Cancer [facility])    prostate   . Dyslipidemia   . Ear infection 09/11/2018   reports the doctor started him on 5 day abx ear drop course  today   . GERD (gastroesophageal reflux disease)   . History of hiatal hernia   . History of kidney stones   . Hypertension    no meds   . Lower leg edema    bilateral   . Neuropathy   . Pre-diabetes    per patient   . SOB (shortness of breath)     Past Surgical History:  Procedure Laterality Date  . APPENDECTOMY     possibly took with colectomy   . CARDIAC CATHETERIZATION  11/27/2016   [facility]   . [place]  04/2018   fusion    . COLECTOMY     with colostomy placement and subsequent  colostomy reversal   . HIP ARTHROPLASTY Left 2014  . LUMBAR SPINE SURGERY     3 surgeries in the 90s  . PENILE PROSTHESIS IMPLANT     s/p prostatectomy   . PERCUTANEOUS CORONARY STENT INTERVENTION (PCI-S)  2015  . PROSTATECTOMY  2003  . TONSILLECTOMY    . TOTAL KNEE ARTHROPLASTY Right 09/16/2018   Procedure: TOTAL KNEE ARTHROPLASTY;  Surgeon: Gaynelle Arabian, MD;  [facility];  Service: Orthopedics;  Laterality: Right;  31min     There were no vitals filed for this visit.  Subjective Assessment - 10/17/18 1514    Subjective  "All right"    Currently in Pain?  Yes    Pain Score  3     Pain Location  Knee    Pain Orientation  Right         [facility] PT Assessment - 10/17/18 0001      Assessment   Medical Diagnosis  R TKR    Referring Provider (PT)  Gaynelle Arabian, MD    Onset Date/Surgical Date  09/16/18    Next MD Visit  10/22/18    Prior Therapy  [facility] PT since surgery                   [facility] Adult PT Treatment/Exercise - 10/17/18 0001      Knee/Hip Exercises: Aerobic   Recumbent Bike  x 5 min for ROM    Nustep  L4 x 6 min       Knee/Hip Exercises: Machines for Strengthening   Cybex Leg Press  20lb 40lb 2x15. RLE 20lb 2x10  Knee/Hip Exercises: Standing   Forward Step Up  Right;Hand Hold: 1;Step Height: 4";2 sets;5 sets    Forward Step Up Limitations  6" step too difficult      Knee/Hip Exercises: Seated   Long Arc Quad  Right;2 sets;Weights;15 reps    Long Arc Quad Weight  3 lbs.    Hamstring Curl  Strengthening;Right;2 sets;15 reps    Hamstring Limitations  Blue tband     Sit to Sand  2 sets;10 reps;without UE support   form blue chair cues for noes over toes      Knee/Hip Exercises: Supine   Short Arc Quad Sets  Strengthening;Right;10 reps;3 sets    Short Arc Quad Sets Limitations  3lb then 5lb      Modalities   Modalities  Cryotherapy      Cryotherapy   Number Minutes Cryotherapy  10 Minutes    Cryotherapy Location  Knee    Type of Cryotherapy  Ice pack      Manual Therapy   Manual Therapy  Passive ROM    Manual therapy comments  taken to end range and held.     Passive ROM  Gentle R knee extension                  PT Long Term Goals - 10/17/18 1620      PT LONG TERM GOAL #1   Title  Independent with ongoing HEP    Status  On-going   Pt reports not doing it at home but understandung it           Plan - 10/17/18 1606    Clinical Impression  Statement  Pt becomes very fatigue with activity, hr does report not doing much at home. I expressed the importance of him doing his HEP and walking at home. Good effort with all strengthening exercises. Pt for frequent LOB with  sit to stands and step ups. Min assist to correct balance. Pt needs to lean posteriorly with sit to stands and step ups. Pt did good with SAQ but is lacking some R knee extension.     Rehab Potential  Good    Clinical Impairments Affecting Rehab Potential  h/o multiple back & neck surgeries, L THR, obesity    PT Frequency  2x / week    PT Duration  4 weeks    PT Next Visit Plan  continue progressive ROM, strengthening, and balance training.  MD note: upcoming visit 10/22/18.        Patient will benefit from skilled therapeutic intervention in order to improve the following deficits and impairments:  Pain, Decreased range of motion, Impaired flexibility, Decreased strength, Difficulty walking, Abnormal gait, Decreased safety awareness, Decreased balance, Decreased activity tolerance, Decreased scar mobility  Visit Diagnosis: Chronic pain of right knee  Muscle weakness (generalized)  Stiffness of right knee, not elsewhere classified  Difficulty in walking, not elsewhere classified  Localized edema     Problem List Patient Active Problem List   Diagnosis Date Noted  . OA (osteoarthritis) of knee 09/16/2018  . Osteoarthritis of right knee 09/16/2018    Scot Jun, PTA 10/17/2018, 4:21 PM  [place], [place], [address] Phone: [phone]   Fax:  [phone]  Name: FREDIE MAJANO MRN: [id number] Date of Birth: [birth year]

## 2018-10-21 ENCOUNTER — Ambulatory Visit: Payer: PPO | Admitting: Physical Therapy

## 2018-10-21 ENCOUNTER — Encounter: Payer: Self-pay | Admitting: Physical Therapy

## 2018-10-21 DIAGNOSIS — G8929 Other chronic pain: Secondary | ICD-10-CM

## 2018-10-21 DIAGNOSIS — M6281 Muscle weakness (generalized): Secondary | ICD-10-CM

## 2018-10-21 DIAGNOSIS — M25561 Pain in right knee: Principal | ICD-10-CM

## 2018-10-21 DIAGNOSIS — M25661 Stiffness of right knee, not elsewhere classified: Secondary | ICD-10-CM

## 2018-10-21 NOTE — Therapy (Signed)
[place], [place], [address] Phone: [phone]   Fax:  [phone]  Physical Therapy Treatment  Patient Details  Name: Bradley Spencer MRN: [id number] Date of Birth: 1941-02-13 Referring Provider (PT): Gaynelle Arabian, MD   Encounter Date: 10/21/2018  PT End of Session - 10/21/18 1508    Visit Number  6    Number of Visits  8    Date for PT Re-Evaluation  10/31/18    PT Start Time  2951    PT Stop Time  1508    PT Time Calculation (min)  43 min    Activity Tolerance  Patient tolerated treatment well;Patient limited by fatigue    Behavior During Therapy  [facility] for tasks assessed/performed       Past Medical History:  Diagnosis Date  . Abnormal EKG    1 degree av block   . Arthritis   . CAD (coronary artery disease)   . Cancer [facility])    prostate   . Dyslipidemia   . Ear infection 09/11/2018   reports the doctor started him on 5 day abx ear drop course  today   . GERD (gastroesophageal reflux disease)   . History of hiatal hernia   . History of kidney stones   . Hypertension    no meds   . Lower leg edema    bilateral   . Neuropathy   . Pre-diabetes    per patient   . SOB (shortness of breath)     Past Surgical History:  Procedure Laterality Date  . APPENDECTOMY     possibly took with colectomy   . CARDIAC CATHETERIZATION  11/27/2016   [facility]   . [place]  04/2018   fusion    . COLECTOMY     with colostomy placement and subsequent  colostomy reversal   . HIP ARTHROPLASTY Left 2014  . LUMBAR SPINE SURGERY     3 surgeries in the 90s  . PENILE PROSTHESIS IMPLANT     s/p prostatectomy   . PERCUTANEOUS CORONARY STENT INTERVENTION (PCI-S)  2015  . PROSTATECTOMY  2003  . TONSILLECTOMY    . TOTAL KNEE ARTHROPLASTY Right 09/16/2018   Procedure: TOTAL KNEE ARTHROPLASTY;  Surgeon: Gaynelle Arabian, MD;  [facility];  Service: Orthopedics;   Laterality: Right;  80min    There were no vitals filed for this visit.  Subjective Assessment - 10/21/18 1427    Subjective  "I don't know, I guess it getting better but I have my doubts"    Currently in Pain?  Yes    Pain Location  Knee    Pain Orientation  Right         [facility] PT Assessment - 10/21/18 0001      Assessment   Medical Diagnosis  R TKR    Referring Provider (PT)  Gaynelle Arabian, MD    Onset Date/Surgical Date  09/16/18    Next MD Visit  10/22/18    Prior Therapy  [facility] PT since surgery      AROM   Right/Left Knee  Right    Right Knee Extension  4    Right Knee Flexion  112                   [facility] Adult PT Treatment/Exercise - 10/21/18 0001      High Level Balance   High Level Balance Activities  Backward walking  Knee/Hip Exercises: Aerobic   Recumbent Bike  x 5 min for ROM    Nustep  L4 x 6 min no UE       Knee/Hip Exercises: Machines for Strengthening   Cybex Knee Extension  10lb 2x10, RLE 5lb 2x10    Cybex Knee Flexion  35lb 2x10, RLE 15lb 2x10     Cybex Leg Press  40lb 3x15. RLE 20lb x15, x10       Knee/Hip Exercises: Standing   Walking with Sports Cord  40lb froward x3, tried backewards but to difficult.                   PT Long Term Goals - 10/17/18 1620      PT LONG TERM GOAL #1   Title  Independent with ongoing HEP    Status  On-going   Pt reports not doing it at home but understandung it           Plan - 10/21/18 1508    Clinical Impression Statement  Pt has progressed increasing his R knee AROM. Pt tends to walk on heels not transitioning weight and pushing off toes despite cures. Does well overall on machine with good strength and ROM. Pt was very negative towards himself with resisted gait. Did well for forward resisted gait but unable to maintain balance to walk backwards. Pt also with some decrease balance with backwards walking. Difficulty shifting weight forward to do step ups. Denied modality post  treatment.    Rehab Potential  Good    Clinical Impairments Affecting Rehab Potential  h/o multiple back & neck surgeries, L THR, obesity    PT Duration  4 weeks    PT Next Visit Plan  Gait, strengthening, and balance training.       Patient will benefit from skilled therapeutic intervention in order to improve the following deficits and impairments:  Pain, Decreased range of motion, Impaired flexibility, Decreased strength, Difficulty walking, Abnormal gait, Decreased safety awareness, Decreased balance, Decreased activity tolerance, Decreased scar mobility  Visit Diagnosis: Chronic pain of right knee  Muscle weakness (generalized)  Stiffness of right knee, not elsewhere classified     Problem List Patient Active Problem List   Diagnosis Date Noted  . OA (osteoarthritis) of knee 09/16/2018  . Osteoarthritis of right knee 09/16/2018    Scot Jun, PTA 10/21/2018, 3:12 PM  [place], [place], [address] Phone: [phone]   Fax:  [phone]  Name: Bradley Spencer MRN: [id number] Date of Birth: 04-Nov-1940

## 2018-10-22 DIAGNOSIS — Z96651 Presence of right artificial knee joint: Secondary | ICD-10-CM | POA: Diagnosis not present

## 2018-10-24 ENCOUNTER — Ambulatory Visit: Payer: PPO | Admitting: Physical Therapy

## 2018-10-28 ENCOUNTER — Encounter: Payer: Self-pay | Admitting: Physical Therapy

## 2018-10-28 ENCOUNTER — Ambulatory Visit: Payer: PPO | Attending: Orthopedic Surgery | Admitting: Physical Therapy

## 2018-10-28 DIAGNOSIS — M25661 Stiffness of right knee, not elsewhere classified: Secondary | ICD-10-CM | POA: Insufficient documentation

## 2018-10-28 DIAGNOSIS — M6281 Muscle weakness (generalized): Secondary | ICD-10-CM | POA: Insufficient documentation

## 2018-10-28 DIAGNOSIS — M25561 Pain in right knee: Secondary | ICD-10-CM | POA: Diagnosis not present

## 2018-10-28 DIAGNOSIS — R262 Difficulty in walking, not elsewhere classified: Secondary | ICD-10-CM | POA: Diagnosis not present

## 2018-10-28 DIAGNOSIS — G8929 Other chronic pain: Secondary | ICD-10-CM | POA: Insufficient documentation

## 2018-10-28 DIAGNOSIS — R600 Localized edema: Secondary | ICD-10-CM | POA: Diagnosis not present

## 2018-10-28 NOTE — Therapy (Signed)
[facility] [address] [place], [place], [address] Phone: [phone]   Fax:  [phone]  Physical Therapy Treatment  Patient Details  Name: Bradley Spencer MRN: [id number] Date of Birth: 06/20/1941 Referring Provider (PT): Frank Aluisio, MD   Encounter Date: 10/28/2018  PT End of Session - 10/28/18 1637    Visit Number  7    Number of Visits  8    Date for PT Re-Evaluation  10/31/18    PT Start Time  1600    PT Stop Time  1643    PT Time Calculation (min)  43 min    Activity Tolerance  Patient tolerated treatment well;Patient limited by fatigue    Behavior During Therapy  [facility] for tasks assessed/performed       Past Medical History:  Diagnosis Date  . Abnormal EKG    1 degree av block   . Arthritis   . CAD (coronary artery disease)   . Cancer (HCC)    prostate   . Dyslipidemia   . Ear infection 09/11/2018   reports the doctor started him on 5 day abx ear drop course  today   . GERD (gastroesophageal reflux disease)   . History of hiatal hernia   . History of kidney stones   . Hypertension    no meds   . Lower leg edema    bilateral   . Neuropathy   . Pre-diabetes    per patient   . SOB (shortness of breath)     Past Surgical History:  Procedure Laterality Date  . APPENDECTOMY     possibly took with colectomy   . CARDIAC CATHETERIZATION  11/27/2016   [facility]   . CERVICAL DISC SURGERY  04/2018   fusion    . COLECTOMY     with colostomy placement and subsequent  colostomy reversal   . HIP ARTHROPLASTY Left 2014  . LUMBAR SPINE SURGERY     3 surgeries in the 90s  . PENILE PROSTHESIS IMPLANT     s/p prostatectomy   . PERCUTANEOUS CORONARY STENT INTERVENTION (PCI-S)  2015  . PROSTATECTOMY  2003  . TONSILLECTOMY    . TOTAL KNEE ARTHROPLASTY Right 09/16/2018   Procedure: TOTAL KNEE ARTHROPLASTY;  Surgeon: Aluisio, Frank, MD;  [facility];  Service: Orthopedics;  Laterality:  Right;  50min    There were no vitals filed for this visit.  Subjective Assessment - 10/28/18 1554    Subjective  "I have been on vacation and haven't done a thing, We probably need to start all over"    Patient Stated Goals  "feels better balanced with everyday activities"    Currently in Pain?  Yes    Pain Score  4     Pain Location  Knee    Pain Orientation  Right         [facility] PT Assessment - 10/28/18 0001      AROM   Right Knee Extension  3    Right Knee Flexion  112                   [facility] Adult PT Treatment/Exercise - 10/28/18 0001      Ambulation/Gait   Stairs  Yes    Stairs Assistance  4: Min guard;4: Min assist    Stair Management Technique  No rails;Alternating pattern;Step to pattern    Number of Stairs  12    Height of Stairs    6    Gait Comments  R knee pain and weakness with decents.      Knee/Hip Exercises: Aerobic   Recumbent Bike  L1 x 6 min seat 8      Knee/Hip Exercises: Machines for Strengthening   Cybex Knee Extension  RLE 5lb 3x10    Cybex Knee Flexion  35lb 2x10, RLE 15lb 2x10     Cybex Leg Press  50lb 2x15. RLE 20lb x15, x10                   PT Long Term Goals - 10/28/18 1617      PT LONG TERM GOAL #1   Title  Independent with ongoing HEP    Status  Achieved      PT LONG TERM GOAL #2   Title  R knee AROM >/= 2-120 dg to allow for normal gait and stair mechanics    Status  Partially Met      PT LONG TERM GOAL #3   Title  R hip and knee strength >/= 4+/5 for improved stability      PT LONG TERM GOAL #4   Title  Patient will ambulate with normal gait pattern w/o AD to allow for normal community access    Status  On-going            Plan - 10/28/18 1638    Clinical Impression Statement  Despite not exercising much on vacation his R knee ROM remains well. Progressed to stair negotiation, step to alternating pattern. Pt demo es weakness and pain in R knee when descending stairs. Pt also very hesitant to  control decent with R knee. Pt reports having neuropath in his feet. "My toes are numb all the time". He ambulated on his heels not pushing off toes, but cant correct with cues. Good effort on machines.    Rehab Potential  Good    Clinical Impairments Affecting Rehab Potential  h/o multiple back & neck surgeries, L THR, obesity    PT Frequency  2x / week    PT Duration  4 weeks    PT Treatment/Interventions  Patient/family education;Therapeutic exercise;Therapeutic activities;Functional mobility training;Gait training;Stair training;Balance training;Neuromuscular re-education;Manual techniques;Scar mobilization;Passive range of motion;Dry needling;Taping;Electrical Stimulation;Cryotherapy;Vasopneumatic Device;ADLs/Self Care Home Management    PT Next Visit Plan  Gait, strengthening, and balance training.       Patient will benefit from skilled therapeutic intervention in order to improve the following deficits and impairments:  Pain, Decreased range of motion, Impaired flexibility, Decreased strength, Difficulty walking, Abnormal gait, Decreased safety awareness, Decreased balance, Decreased activity tolerance, Decreased scar mobility  Visit Diagnosis: Chronic pain of right knee  Muscle weakness (generalized)  Stiffness of right knee, not elsewhere classified  Difficulty in walking, not elsewhere classified  Localized edema     Problem List Patient Active Problem List   Diagnosis Date Noted  . OA (osteoarthritis) of knee 09/16/2018  . Osteoarthritis of right knee 09/16/2018    Scot Jun 10/28/2018, 4:43 PM  [place], [place], [address] Phone: [phone]   Fax:  [phone]  Name: Bradley Spencer MRN: [id number] Date of Birth: 05-20-1941

## 2018-10-31 ENCOUNTER — Ambulatory Visit: Payer: PPO | Admitting: Physical Therapy

## 2018-10-31 ENCOUNTER — Encounter: Payer: Self-pay | Admitting: Physical Therapy

## 2018-10-31 DIAGNOSIS — M25561 Pain in right knee: Principal | ICD-10-CM

## 2018-10-31 DIAGNOSIS — G8929 Other chronic pain: Secondary | ICD-10-CM

## 2018-10-31 DIAGNOSIS — R262 Difficulty in walking, not elsewhere classified: Secondary | ICD-10-CM

## 2018-10-31 DIAGNOSIS — M6281 Muscle weakness (generalized): Secondary | ICD-10-CM

## 2018-10-31 DIAGNOSIS — M25661 Stiffness of right knee, not elsewhere classified: Secondary | ICD-10-CM

## 2018-10-31 DIAGNOSIS — R600 Localized edema: Secondary | ICD-10-CM

## 2018-10-31 NOTE — Therapy (Signed)
[place], [place], [address] Phone: [phone]   Fax:  [phone]  Physical Therapy Treatment  Patient Details  Name: MONTREAL STEIDLE MRN: [id number] Date of Birth: [birth year] Referring Provider (PT): Gaynelle Arabian, MD   Encounter Date: 10/31/2018  PT End of Session - 10/31/18 1429    Visit Number  8    Date for PT Re-Evaluation  10/31/18    PT Start Time  8315    PT Stop Time  1438    PT Time Calculation (min)  53 min    Activity Tolerance  Patient tolerated treatment well    Behavior During Therapy  [facility] for tasks assessed/performed       Past Medical History:  Diagnosis Date  . Abnormal EKG    1 degree av block   . Arthritis   . CAD (coronary artery disease)   . Cancer [facility])    prostate   . Dyslipidemia   . Ear infection 09/11/2018   reports the doctor started him on 5 day abx ear drop course  today   . GERD (gastroesophageal reflux disease)   . History of hiatal hernia   . History of kidney stones   . Hypertension    no meds   . Lower leg edema    bilateral   . Neuropathy   . Pre-diabetes    per patient   . SOB (shortness of breath)     Past Surgical History:  Procedure Laterality Date  . APPENDECTOMY     possibly took with colectomy   . CARDIAC CATHETERIZATION  11/27/2016   [facility]   . [place]  04/2018   fusion    . COLECTOMY     with colostomy placement and subsequent  colostomy reversal   . HIP ARTHROPLASTY Left 2014  . LUMBAR SPINE SURGERY     3 surgeries in the 90s  . PENILE PROSTHESIS IMPLANT     s/p prostatectomy   . PERCUTANEOUS CORONARY STENT INTERVENTION (PCI-S)  2015  . PROSTATECTOMY  2003  . TONSILLECTOMY    . TOTAL KNEE ARTHROPLASTY Right 09/16/2018   Procedure: TOTAL KNEE ARTHROPLASTY;  Surgeon: Gaynelle Arabian, MD;  [facility]: [facility];  Service: Orthopedics;  Laterality: Right;  64mn    There were no vitals filed for  this visit.  Subjective Assessment - 10/31/18 1347    Subjective  "Pretty good"    Currently in Pain?  Yes    Pain Score  2     Pain Location  Knee    Pain Orientation  Right         [facility] PT Assessment - 10/31/18 0001      Assessment   Medical Diagnosis  R TKR    Referring Provider (PT)  FGaynelle Arabian MD      AROM   Right Knee Extension  3    Right Knee Flexion  112                   [facility] Adult PT Treatment/Exercise - 10/31/18 0001      Ambulation/Gait   Stairs  Yes    Stairs Assistance  6: Modified independent (Device/Increase time);5: Supervision;4: Min guard    Stair Management Technique  Two rails;Alternating pattern    Number of Stairs  36    Height of Stairs  6      High Level Balance   High Level Balance  Activities  Side stepping;Backward walking   SBA side step, CGA backwards   High Level Balance Comments  Standing on airex       Knee/Hip Exercises: Aerobic   Recumbent Bike  L1 x 4 min seat 8    Nustep  L4 x 6 min no UE       Knee/Hip Exercises: Machines for Strengthening   Cybex Leg Press  60lb 2x15.      Knee/Hip Exercises: Seated   Sit to Sand  2 sets;10 reps;without UE support   blue chair                  PT Long Term Goals - 10/28/18 1617      PT LONG TERM GOAL #1   Title  Independent with ongoing HEP    Status  Achieved      PT LONG TERM GOAL #2   Title  R knee AROM >/= 2-120 dg to allow for normal gait and stair mechanics    Status  Partially Met      PT LONG TERM GOAL #3   Title  R hip and knee strength >/= 4+/5 for improved stability      PT LONG TERM GOAL #4   Title  Patient will ambulate with normal gait pattern w/o AD to allow for normal community access    Status  On-going            Plan - 10/31/18 1434    Clinical Impression Statement  Pt has progressed with stair negotiation he is mod I with two rails, stairs are very taxing on pt, he becomes very fatigue. Good strength and ROM with machines  and sit to strands. Pt is very unstable with balance interventions. He ambulates on his heel. Posterior lean when standing on airex decrease righting reaction when standing on airex. CGA needed with backwards walking, LOB x1 min assist to correct.    Clinical Impairments Affecting Rehab Potential  h/o multiple back & neck surgeries, L THR, obesity    PT Frequency  2x / week    PT Treatment/Interventions  Patient/family education;Therapeutic exercise;Therapeutic activities;Functional mobility training;Gait training;Stair training;Balance training;Neuromuscular re-education;Manual techniques;Scar mobilization;Passive range of motion;Dry needling;Taping;Electrical Stimulation;Cryotherapy;Vasopneumatic Device;ADLs/Self Care Home Management    PT Next Visit Plan  Gait, strengthening, and balance training.  **Dr. Wynelle Link, the continuation of PT is for him being so deconditioned prior to surgery and him having very poor balance and being at an increased risk for falls.  The knee is great!      Patient will benefit from skilled therapeutic intervention in order to improve the following deficits and impairments:  Pain, Decreased range of motion, Impaired flexibility, Decreased strength, Difficulty walking, Abnormal gait, Decreased safety awareness, Decreased balance, Decreased activity tolerance, Decreased scar mobility  Visit Diagnosis: Chronic pain of right knee  Muscle weakness (generalized)  Stiffness of right knee, not elsewhere classified  Difficulty in walking, not elsewhere classified  Localized edema     Problem List Patient Active Problem List   Diagnosis Date Noted  . OA (osteoarthritis) of knee 09/16/2018  . Osteoarthritis of right knee 09/16/2018    Scot Jun, PTA 10/31/2018, 2:39 PM [address] [place], [place], [address] Phone: [phone]   Fax:  [phone]  Name:  LUDGER BONES MRN: [id number] Date of Birth: July 19, 1941

## 2018-11-11 ENCOUNTER — Encounter: Payer: Self-pay | Admitting: Physical Therapy

## 2018-11-11 ENCOUNTER — Ambulatory Visit: Payer: PPO | Admitting: Physical Therapy

## 2018-11-11 DIAGNOSIS — R600 Localized edema: Secondary | ICD-10-CM

## 2018-11-11 DIAGNOSIS — M25561 Pain in right knee: Principal | ICD-10-CM

## 2018-11-11 DIAGNOSIS — M25661 Stiffness of right knee, not elsewhere classified: Secondary | ICD-10-CM

## 2018-11-11 DIAGNOSIS — R262 Difficulty in walking, not elsewhere classified: Secondary | ICD-10-CM

## 2018-11-11 DIAGNOSIS — G8929 Other chronic pain: Secondary | ICD-10-CM

## 2018-11-11 DIAGNOSIS — M6281 Muscle weakness (generalized): Secondary | ICD-10-CM

## 2018-11-11 NOTE — Therapy (Signed)
[facility] [address] [place], [place], [address] Phone: [phone]   Fax:  [phone]  Physical Therapy Treatment  Patient Details  Name: Bradley Spencer MRN: [id number] Date of Birth: 05/11/1941 Referring Provider (PT): Frank Aluisio, MD   Encounter Date: 11/11/2018  PT End of Session - 11/11/18 1340    Visit Number  9    Date for PT Re-Evaluation  10/31/18    PT Start Time  1300    PT Stop Time  1342    PT Time Calculation (min)  42 min    Behavior During Therapy  [facility] for tasks assessed/performed       Past Medical History:  Diagnosis Date  . Abnormal EKG    1 degree av block   . Arthritis   . CAD (coronary artery disease)   . Cancer (HCC)    prostate   . Dyslipidemia   . Ear infection 09/11/2018   reports the doctor started him on 5 day abx ear drop course  today   . GERD (gastroesophageal reflux disease)   . History of hiatal hernia   . History of kidney stones   . Hypertension    no meds   . Lower leg edema    bilateral   . Neuropathy   . Pre-diabetes    per patient   . SOB (shortness of breath)     Past Surgical History:  Procedure Laterality Date  . APPENDECTOMY     possibly took with colectomy   . CARDIAC CATHETERIZATION  11/27/2016   [facility]   . CERVICAL DISC SURGERY  04/2018   fusion    . COLECTOMY     with colostomy placement and subsequent  colostomy reversal   . HIP ARTHROPLASTY Left 2014  . LUMBAR SPINE SURGERY     3 surgeries in the 90s  . PENILE PROSTHESIS IMPLANT     s/p prostatectomy   . PERCUTANEOUS CORONARY STENT INTERVENTION (PCI-S)  2015  . PROSTATECTOMY  2003  . TONSILLECTOMY    . TOTAL KNEE ARTHROPLASTY Right 09/16/2018   Procedure: TOTAL KNEE ARTHROPLASTY;  Surgeon: Aluisio, Frank, MD;  [facility];  Service: Orthopedics;  Laterality: Right;  50min    There were no vitals filed for this visit.  Subjective Assessment - 11/11/18 1300     Subjective  "I have not exercised in a week"    Currently in Pain?  Yes    Pain Score  1     Pain Location  Knee    Pain Orientation  Right                       [facility] Adult PT Treatment/Exercise - 11/11/18 0001      Knee/Hip Exercises: Aerobic   Elliptical  I5 R 10 1 min each way    Recumbent Bike  L1 x 5 min seat 8      Knee/Hip Exercises: Machines for Strengthening   Cybex Knee Extension  20lb 2x10 RLE 10lb x10     Cybex Knee Flexion  35lb, 45lb  2x15    Cybex Leg Press  60lb 2x15.      Knee/Hip Exercises: Standing   Forward Step Up  Right;2 sets;5 reps;10 reps;Hand Hold: 1;Step Height: 6"                  PT Long Term Goals - 10/28/18 1617        PT LONG TERM GOAL #1   Title  Independent with ongoing HEP    Status  Achieved      PT LONG TERM GOAL #2   Title  R knee AROM >/= 2-120 dg to allow for normal gait and stair mechanics    Status  Partially Met      PT LONG TERM GOAL #3   Title  R hip and knee strength >/= 4+/5 for improved stability      PT LONG TERM GOAL #4   Title  Patient will ambulate with normal gait pattern w/o AD to allow for normal community access    Status  On-going            Plan - 11/11/18 1342    Clinical Impression Statement  Pt report that she has not done any exercises all week. Good strength and ROM remains. CGA assist needed with step ups due top decrease balance. Pt ~ to ambulate on heels. Little ankle DF noted with heel raises. No reports of pain. Pt able to increase weigh on all Le strengthening exercises.     Rehab Potential  Good    Clinical Impairments Affecting Rehab Potential  h/o multiple back & neck surgeries, L THR, obesity    PT Frequency  2x / week    PT Duration  4 weeks    PT Treatment/Interventions  Patient/family education;Therapeutic exercise;Therapeutic activities;Functional mobility training;Gait training;Stair training;Balance training;Neuromuscular re-education;Manual techniques;Scar  mobilization;Passive range of motion;Dry needling;Taping;Electrical Stimulation;Cryotherapy;Vasopneumatic Device;ADLs/Self Care Home Management    PT Next Visit Plan  Gait, strengthening, and balance training.       Patient will benefit from skilled therapeutic intervention in order to improve the following deficits and impairments:  Pain, Decreased range of motion, Impaired flexibility, Decreased strength, Difficulty walking, Abnormal gait, Decreased safety awareness, Decreased balance, Decreased activity tolerance, Decreased scar mobility  Visit Diagnosis: Chronic pain of right knee  Muscle weakness (generalized)  Stiffness of right knee, not elsewhere classified  Difficulty in walking, not elsewhere classified  Localized edema     Problem List Patient Active Problem List   Diagnosis Date Noted  . OA (osteoarthritis) of knee 09/16/2018  . Osteoarthritis of right knee 09/16/2018    Ronald G Pemberton, PTA 11/11/2018, 1:45 PM  [facility] [address] [place], [place], [address] Phone: [phone]   Fax:  [phone]  Name: Bradley Spencer MRN: [id number] Date of Birth: 01/12/1941   

## 2018-11-14 ENCOUNTER — Ambulatory Visit: Payer: PPO | Admitting: Physical Therapy

## 2018-11-14 ENCOUNTER — Encounter: Payer: Self-pay | Admitting: Physical Therapy

## 2018-11-14 DIAGNOSIS — M25661 Stiffness of right knee, not elsewhere classified: Secondary | ICD-10-CM

## 2018-11-14 DIAGNOSIS — M25561 Pain in right knee: Secondary | ICD-10-CM | POA: Diagnosis not present

## 2018-11-14 DIAGNOSIS — G8929 Other chronic pain: Secondary | ICD-10-CM

## 2018-11-14 DIAGNOSIS — M6281 Muscle weakness (generalized): Secondary | ICD-10-CM

## 2018-11-14 NOTE — Therapy (Signed)
[name], [place], [address] Phone: [phone]   Fax:  [phone] Progress Note Reporting Period 10/03/18 to 11/14/18 for the first 10 visits  See note below for Objective Data and Assessment of Progress/Goals.      Physical Therapy Treatment  Patient Details  Name: Bradley Spencer MRN: [id number] Date of Birth: Oct 16, 1940 Referring Provider (PT): Gaynelle Arabian, MD   Encounter Date: 11/14/2018  PT End of Session - 11/14/18 1406    Visit Number  10    Date for PT Re-Evaluation  12/03/18    PT Start Time  4503    PT Stop Time  8882    PT Time Calculation (min)  43 min    Activity Tolerance  Patient tolerated treatment well;No increased pain    Behavior During Therapy  W[facility] for tasks assessed/performed       Past Medical History:  Diagnosis Date  . Abnormal EKG    1 degree av block   . Arthritis   . CAD (coronary artery disease)   . Cancer [facility])    prostate   . Dyslipidemia   . Ear infection 09/11/2018   reports the doctor started him on 5 day abx ear drop course  today   . GERD (gastroesophageal reflux disease)   . History of hiatal hernia   . History of kidney stones   . Hypertension    no meds   . Lower leg edema    bilateral   . Neuropathy   . Pre-diabetes    per patient   . SOB (shortness of breath)     Past Surgical History:  Procedure Laterality Date  . APPENDECTOMY     possibly took with colectomy   . CARDIAC CATHETERIZATION  11/27/2016   [facility]   . [place]  04/2018   fusion    . COLECTOMY     with colostomy placement and subsequent  colostomy reversal   . HIP ARTHROPLASTY Left 2014  . LUMBAR SPINE SURGERY     3 surgeries in the 90s  . PENILE PROSTHESIS IMPLANT     s/p prostatectomy   . PERCUTANEOUS CORONARY STENT INTERVENTION (PCI-S)  2015  . PROSTATECTOMY  2003  . TONSILLECTOMY    . TOTAL KNEE ARTHROPLASTY Right 09/16/2018    Procedure: TOTAL KNEE ARTHROPLASTY;  Surgeon: Gaynelle Arabian, MD;  [facility];  Service: Orthopedics;  Laterality: Right;  22mn    There were no vitals filed for this visit.  Subjective Assessment - 11/14/18 1407    Subjective  "If I exercised at home, I know I'd be doing better".   Pt reports he is not doing the exercises, except trying to do stairs (reciprocal pattern), "like a normal person".     Currently in Pain?  Yes    Pain Score  3     Pain Location  Knee    Pain Orientation  Right    Pain Descriptors / Indicators  Aching    Aggravating Factors   exercises    Pain Relieving Factors  medication, ice         [facility] PT Assessment - 11/14/18 0001      Assessment   Medical Diagnosis  R TKR    Referring Provider (PT)  FGaynelle Arabian MD    Onset Date/Surgical Date  09/16/18      Observation/Other Assessments   Focus on Therapeutic Outcomes (FOTO)  44% limited      PROM   PROM Assessment Site  Knee    Right/Left Knee  Right    Right Knee Extension  5    Right Knee Flexion  115      Strength   Right Hip ABduction  4/5    Right Hip ADduction  5/5    Right/Left Knee  Right    Right Knee Flexion  5/5    Right Knee Extension  5/5       [facility] Adult PT Treatment/Exercise - 11/14/18 0001      Knee/Hip Exercises: Stretches   Passive Hamstring Stretch  Right;2 reps;20 seconds   seated with straight back   Gastroc Stretch  Right;Left;2 reps;20 seconds      Knee/Hip Exercises: Aerobic   Recumbent Bike  L2-3 x 5 min seat 8      Knee/Hip Exercises: Machines for Strengthening   Cybex Knee Extension  20lb 2x10 RLE 10lb x10     Cybex Knee Flexion  35# x 15 reps, 45# x 15 reps x 2    Cybex Leg Press  60lb 2x15.      Knee/Hip Exercises: Standing   Stairs  12 stairs reciprocal pattern descending / ascending with unilateral rail and SBA for safety.     Other Standing Knee Exercises  semi-tandem stance at sink without  UE support x 10 sec x 2 reps       Knee/Hip  Exercises: Seated   Other Seated Knee/Hip Exercises  Long sitting with overpressure into Rt knee ext x 30 sec;   seated scoot for Inc Rt knee flexion x 1 rep of 10 sec            PT Long Term Goals - 11/14/18 1453      PT LONG TERM GOAL #1   Title  Independent with ongoing HEP    Status  On-going      PT LONG TERM GOAL #2   Title  R knee AROM >/= 2-120 dg to allow for normal gait and stair mechanics    Status  On-going      PT LONG TERM GOAL #3   Title  R hip and knee strength >/= 4+/5 for improved stability    Status  Partially Met      PT LONG TERM GOAL #4   Title  Patient will ambulate with normal gait pattern w/o AD to allow for normal community access    Baseline  can ambulate without AD, but has some balance deficits at times with gait.     Status  Partially Met      PT LONG TERM GOAL #5   Title  Patient will negotiate stairs with good reciprocal pattern w/o evidence of R knee instability to allow safe access to all levels of home    Status  Partially Met            Plan - 11/14/18 1454    Clinical Impression Statement  Pt's Rt knee ROM is gradually improving.  His Rt knee strength has improved.  He is able to complete reciprocal step pattern with unilateral UE support with mod difficulty.  Encouragement given for pt to be more compliant with HEP as to assist with meeting established therapy goals. His FOTO score improved to 44% limited.  Pt has partially met his goals and will benefit from continued PT intervention to max functional mobility and safety.     PT Treatment/Interventions  Patient/family education;Therapeutic exercise;Therapeutic activities;Functional   mobility training;Gait training;Stair training;Balance training;Neuromuscular re-education;Manual techniques;Scar mobilization;Passive range of motion;Dry needling;Taping;Electrical Stimulation;Cryotherapy;Vasopneumatic Device;ADLs/Self Care Home Management    PT Next Visit Plan  continue progressive LE  strengthening, gait, and balance exercises.     Consulted and Agree with Plan of Care  Patient       Patient will benefit from skilled therapeutic intervention in order to improve the following deficits and impairments:  Pain, Decreased range of motion, Impaired flexibility, Decreased strength, Difficulty walking, Abnormal gait, Decreased safety awareness, Decreased balance, Decreased activity tolerance, Decreased scar mobility  Visit Diagnosis: Chronic pain of right knee  Muscle weakness (generalized)  Stiffness of right knee, not elsewhere classified     Problem List Patient Active Problem List   Diagnosis Date Noted  . OA (osteoarthritis) of knee 09/16/2018  . Osteoarthritis of right knee 09/16/2018   Kerin Perna, PTA 11/14/18 3:08 PM  [place], [place], [address] Phone: [phone]   Fax:  [phone]  Name: BRAEDIN MILLHOUSE MRN: [id number] Date of Birth: 01-Aug-1941

## 2018-11-18 ENCOUNTER — Encounter: Payer: Self-pay | Admitting: Physical Therapy

## 2018-11-18 ENCOUNTER — Ambulatory Visit: Payer: PPO | Admitting: Physical Therapy

## 2018-11-18 DIAGNOSIS — M25561 Pain in right knee: Principal | ICD-10-CM

## 2018-11-18 DIAGNOSIS — G8929 Other chronic pain: Secondary | ICD-10-CM

## 2018-11-18 DIAGNOSIS — M6281 Muscle weakness (generalized): Secondary | ICD-10-CM

## 2018-11-18 DIAGNOSIS — M25661 Stiffness of right knee, not elsewhere classified: Secondary | ICD-10-CM

## 2018-11-18 NOTE — Therapy (Addendum)
[place], [place], [address] Phone: [phone]   Fax:  [phone]  Physical Therapy Treatment  Patient Details  Name: Bradley Spencer MRN: [id number] Date of Birth: 1941-05-11 Referring Provider (PT): Gaynelle Arabian, MD   Encounter Date: 11/18/2018  PT End of Session - 11/18/18 1342    Visit Number  11    PT Start Time  1300    PT Stop Time  1342    PT Time Calculation (min)  42 min    Activity Tolerance  Patient tolerated treatment well    Behavior During Therapy  [facility] for tasks assessed/performed       Past Medical History:  Diagnosis Date  . Abnormal EKG    1 degree av block   . Arthritis   . CAD (coronary artery disease)   . Cancer [facility])    prostate   . Dyslipidemia   . Ear infection 09/11/2018   reports the doctor started him on 5 day abx ear drop course  today   . GERD (gastroesophageal reflux disease)   . History of hiatal hernia   . History of kidney stones   . Hypertension    no meds   . Lower leg edema    bilateral   . Neuropathy   . Pre-diabetes    per patient   . SOB (shortness of breath)     Past Surgical History:  Procedure Laterality Date  . APPENDECTOMY     possibly took with colectomy   . CARDIAC CATHETERIZATION  11/27/2016   [facility]   . [place]  04/2018   fusion    . COLECTOMY     with colostomy placement and subsequent  colostomy reversal   . HIP ARTHROPLASTY Left 2014  . LUMBAR SPINE SURGERY     3 surgeries in the 90s  . PENILE PROSTHESIS IMPLANT     s/p prostatectomy   . PERCUTANEOUS CORONARY STENT INTERVENTION (PCI-S)  2015  . PROSTATECTOMY  2003  . TONSILLECTOMY    . TOTAL KNEE ARTHROPLASTY Right 09/16/2018   Procedure: TOTAL KNEE ARTHROPLASTY;  Surgeon: Gaynelle Arabian, MD;  [facility];  Service: Orthopedics;  Laterality: Right;  19mn    There were no vitals filed for this visit.  Subjective Assessment  - 11/18/18 1300    Subjective  "Same old same old"    Currently in Pain?  Yes    Pain Score  1     Pain Location  Knee    Pain Orientation  Right                       [facility] Adult PT Treatment/Exercise - 11/18/18 0001      Ambulation/Gait   Stairs  Yes    Stairs Assistance  6: Modified independent (Device/Increase time)    Stair Management Technique  Two rails;Alternating pattern    Number of Stairs  48    Height of Stairs  6      Knee/Hip Exercises: Stretches   Gastroc Stretch  Right;Left;2 reps;10 seconds;20 seconds      Knee/Hip Exercises: Aerobic   Elliptical  I7 R7 x2 min     Recumbent Bike  L1 x 5 min seat 7      Knee/Hip Exercises: Machines for Strengthening   Cybex Knee Extension  RLE 15lb 2x10     Cybex Knee Flexion  RLE 25lb 2x10  Knee/Hip Exercises: Standing   Heel Raises  Both;10 reps;2 sets                  PT Long Term Goals - 11/18/18 1343      PT LONG TERM GOAL #1   Title  Independent with ongoing HEP    Status  On-going      PT LONG TERM GOAL #2   Title  R knee AROM >/= 2-120 dg to allow for normal gait and stair mechanics    Status  Partially Met      PT LONG TERM GOAL #3   Title  R hip and knee strength >/= 4+/5 for improved stability    Status  Partially Met      PT LONG TERM GOAL #4   Title  Patient will ambulate with normal gait pattern w/o AD to allow for normal community access    Status  Partially Met      PT LONG TERM GOAL #5   Title  Patient will negotiate stairs with good reciprocal pattern w/o evidence of R knee instability to allow safe access to all levels of home    Status  Partially Met            Plan - 11/18/18 1343    Clinical Impression Statement  Pt reports that he is pleased with his current functional status. He has good strength and ROM in his R knee. Pt reports that his R knee is stronger than his CL. R knee is fine but pt does have some balance issues overall. Calves are very tight  noted with stretching. Pt became very fatigue with elliptical and stair negotiation. Goals are partially met but again pt reports no functional limitations at home. Pt requested that's this be his last visit.    Clinical Impairments Affecting Rehab Potential  h/o multiple back & neck surgeries, L THR, obesity    PT Frequency  2x / week    PT Duration  4 weeks    PT Treatment/Interventions  Patient/family education;Therapeutic exercise;Therapeutic activities;Functional mobility training;Gait training;Stair training;Balance training;Neuromuscular re-education;Manual techniques;Scar mobilization;Passive range of motion;Dry needling;Taping;Electrical Stimulation;Cryotherapy;Vasopneumatic Device;ADLs/Self Care Home Management    PT Next Visit Plan  D/C Pt       Patient will benefit from skilled therapeutic intervention in order to improve the following deficits and impairments:  Pain, Decreased range of motion, Impaired flexibility, Decreased strength, Difficulty walking, Abnormal gait, Decreased safety awareness, Decreased balance, Decreased activity tolerance, Decreased scar mobility  Visit Diagnosis: Chronic pain of right knee  Muscle weakness (generalized)  Stiffness of right knee, not elsewhere classified     Problem List Patient Active Problem List   Diagnosis Date Noted  . OA (osteoarthritis) of knee 09/16/2018  . Osteoarthritis of right knee 09/16/2018   PHYSICAL THERAPY DISCHARGE SUMMARY  Visits from Start of Care: 11 Plan: Patient agrees to discharge.  Patient goals were not met. Patient is being discharged due to being pleased with the current functional level.  ?????      Scot Jun, PTA 11/18/2018, 1:49 PM  [place] [address] [place], [place], [address] Phone: [phone]   Fax:  [phone]  Name: Bradley Spencer MRN: [id number] Date of Birth: [birth year]  Madelyn Flavors, PT 11/18/18 5:56  PM [place], [place], [address] Phone: [phone]   Fax:  [phone]

## 2018-11-21 ENCOUNTER — Ambulatory Visit: Payer: PPO | Admitting: Physical Therapy

## 2018-12-30 DIAGNOSIS — Z8719 Personal history of other diseases of the digestive system: Secondary | ICD-10-CM | POA: Diagnosis not present

## 2018-12-30 DIAGNOSIS — R109 Unspecified abdominal pain: Secondary | ICD-10-CM | POA: Diagnosis not present

## 2018-12-30 DIAGNOSIS — K5669 Other partial intestinal obstruction: Secondary | ICD-10-CM | POA: Diagnosis not present

## 2018-12-30 DIAGNOSIS — K436 Other and unspecified ventral hernia with obstruction, without gangrene: Secondary | ICD-10-CM | POA: Diagnosis not present

## 2018-12-30 DIAGNOSIS — N202 Calculus of kidney with calculus of ureter: Secondary | ICD-10-CM | POA: Diagnosis not present

## 2018-12-30 DIAGNOSIS — K5730 Diverticulosis of large intestine without perforation or abscess without bleeding: Secondary | ICD-10-CM | POA: Diagnosis not present

## 2018-12-30 DIAGNOSIS — K56609 Unspecified intestinal obstruction, unspecified as to partial versus complete obstruction: Secondary | ICD-10-CM | POA: Diagnosis not present

## 2019-01-01 DIAGNOSIS — N1832 Chronic kidney disease, stage 3b: Secondary | ICD-10-CM | POA: Diagnosis not present

## 2019-01-01 DIAGNOSIS — I10 Essential (primary) hypertension: Secondary | ICD-10-CM | POA: Diagnosis not present

## 2019-01-01 DIAGNOSIS — K430 Incisional hernia with obstruction, without gangrene: Secondary | ICD-10-CM | POA: Diagnosis not present

## 2019-01-29 DIAGNOSIS — K430 Incisional hernia with obstruction, without gangrene: Secondary | ICD-10-CM | POA: Diagnosis not present

## 2019-02-12 DIAGNOSIS — L720 Epidermal cyst: Secondary | ICD-10-CM | POA: Diagnosis not present

## 2019-02-12 DIAGNOSIS — L814 Other melanin hyperpigmentation: Secondary | ICD-10-CM | POA: Diagnosis not present

## 2019-02-12 DIAGNOSIS — Z08 Encounter for follow-up examination after completed treatment for malignant neoplasm: Secondary | ICD-10-CM | POA: Diagnosis not present

## 2019-02-12 DIAGNOSIS — Z85828 Personal history of other malignant neoplasm of skin: Secondary | ICD-10-CM | POA: Diagnosis not present

## 2019-02-12 DIAGNOSIS — L821 Other seborrheic keratosis: Secondary | ICD-10-CM | POA: Diagnosis not present

## 2019-02-12 DIAGNOSIS — D1801 Hemangioma of skin and subcutaneous tissue: Secondary | ICD-10-CM | POA: Diagnosis not present

## 2019-02-28 DIAGNOSIS — I10 Essential (primary) hypertension: Secondary | ICD-10-CM | POA: Diagnosis not present

## 2019-02-28 DIAGNOSIS — R7303 Prediabetes: Secondary | ICD-10-CM | POA: Diagnosis not present

## 2019-02-28 DIAGNOSIS — I2510 Atherosclerotic heart disease of native coronary artery without angina pectoris: Secondary | ICD-10-CM | POA: Diagnosis not present

## 2019-02-28 DIAGNOSIS — M2550 Pain in unspecified joint: Secondary | ICD-10-CM | POA: Diagnosis not present

## 2019-02-28 DIAGNOSIS — J449 Chronic obstructive pulmonary disease, unspecified: Secondary | ICD-10-CM | POA: Diagnosis not present

## 2019-02-28 DIAGNOSIS — R5383 Other fatigue: Secondary | ICD-10-CM | POA: Diagnosis not present

## 2019-02-28 DIAGNOSIS — E785 Hyperlipidemia, unspecified: Secondary | ICD-10-CM | POA: Diagnosis not present

## 2019-04-15 DIAGNOSIS — M545 Low back pain: Secondary | ICD-10-CM | POA: Diagnosis not present

## 2019-04-15 DIAGNOSIS — M5136 Other intervertebral disc degeneration, lumbar region: Secondary | ICD-10-CM | POA: Diagnosis not present

## 2019-04-28 DIAGNOSIS — Z8546 Personal history of malignant neoplasm of prostate: Secondary | ICD-10-CM | POA: Diagnosis not present

## 2019-04-28 DIAGNOSIS — Z87891 Personal history of nicotine dependence: Secondary | ICD-10-CM | POA: Diagnosis not present

## 2019-05-06 DIAGNOSIS — M5136 Other intervertebral disc degeneration, lumbar region: Secondary | ICD-10-CM | POA: Diagnosis not present

## 2019-05-09 DIAGNOSIS — Z7409 Other reduced mobility: Secondary | ICD-10-CM | POA: Diagnosis not present

## 2019-05-09 DIAGNOSIS — R29898 Other symptoms and signs involving the musculoskeletal system: Secondary | ICD-10-CM | POA: Diagnosis not present

## 2019-05-09 DIAGNOSIS — Z789 Other specified health status: Secondary | ICD-10-CM | POA: Diagnosis not present

## 2019-05-09 DIAGNOSIS — R2689 Other abnormalities of gait and mobility: Secondary | ICD-10-CM | POA: Diagnosis not present

## 2019-05-09 DIAGNOSIS — M2560 Stiffness of unspecified joint, not elsewhere classified: Secondary | ICD-10-CM | POA: Diagnosis not present

## 2019-05-09 DIAGNOSIS — M545 Low back pain: Secondary | ICD-10-CM | POA: Diagnosis not present

## 2019-05-12 DIAGNOSIS — L821 Other seborrheic keratosis: Secondary | ICD-10-CM | POA: Diagnosis not present

## 2019-05-12 DIAGNOSIS — Z08 Encounter for follow-up examination after completed treatment for malignant neoplasm: Secondary | ICD-10-CM | POA: Diagnosis not present

## 2019-05-12 DIAGNOSIS — L720 Epidermal cyst: Secondary | ICD-10-CM | POA: Diagnosis not present

## 2019-05-12 DIAGNOSIS — D692 Other nonthrombocytopenic purpura: Secondary | ICD-10-CM | POA: Diagnosis not present

## 2019-05-12 DIAGNOSIS — Z85828 Personal history of other malignant neoplasm of skin: Secondary | ICD-10-CM | POA: Diagnosis not present

## 2019-05-21 DIAGNOSIS — G894 Chronic pain syndrome: Secondary | ICD-10-CM | POA: Diagnosis not present

## 2019-05-21 DIAGNOSIS — M5136 Other intervertebral disc degeneration, lumbar region: Secondary | ICD-10-CM | POA: Diagnosis not present

## 2019-05-28 DIAGNOSIS — Z7409 Other reduced mobility: Secondary | ICD-10-CM | POA: Diagnosis not present

## 2019-05-28 DIAGNOSIS — M545 Low back pain: Secondary | ICD-10-CM | POA: Diagnosis not present

## 2019-05-28 DIAGNOSIS — R29898 Other symptoms and signs involving the musculoskeletal system: Secondary | ICD-10-CM | POA: Diagnosis not present

## 2019-05-28 DIAGNOSIS — Z789 Other specified health status: Secondary | ICD-10-CM | POA: Diagnosis not present

## 2019-05-28 DIAGNOSIS — R2689 Other abnormalities of gait and mobility: Secondary | ICD-10-CM | POA: Diagnosis not present

## 2019-05-28 DIAGNOSIS — M2560 Stiffness of unspecified joint, not elsewhere classified: Secondary | ICD-10-CM | POA: Diagnosis not present

## 2019-05-30 DIAGNOSIS — Z79899 Other long term (current) drug therapy: Secondary | ICD-10-CM | POA: Diagnosis not present

## 2019-05-30 DIAGNOSIS — I10 Essential (primary) hypertension: Secondary | ICD-10-CM | POA: Diagnosis not present

## 2019-05-30 DIAGNOSIS — E785 Hyperlipidemia, unspecified: Secondary | ICD-10-CM | POA: Diagnosis not present

## 2019-05-30 DIAGNOSIS — I2510 Atherosclerotic heart disease of native coronary artery without angina pectoris: Secondary | ICD-10-CM | POA: Diagnosis not present

## 2019-05-30 DIAGNOSIS — G894 Chronic pain syndrome: Secondary | ICD-10-CM | POA: Diagnosis not present

## 2019-05-30 DIAGNOSIS — Z23 Encounter for immunization: Secondary | ICD-10-CM | POA: Diagnosis not present

## 2019-05-30 DIAGNOSIS — Z7982 Long term (current) use of aspirin: Secondary | ICD-10-CM | POA: Diagnosis not present

## 2019-06-05 DIAGNOSIS — M545 Low back pain: Secondary | ICD-10-CM | POA: Diagnosis not present

## 2019-06-16 DIAGNOSIS — J449 Chronic obstructive pulmonary disease, unspecified: Secondary | ICD-10-CM | POA: Diagnosis not present

## 2019-06-16 DIAGNOSIS — I2510 Atherosclerotic heart disease of native coronary artery without angina pectoris: Secondary | ICD-10-CM | POA: Diagnosis not present

## 2019-06-16 DIAGNOSIS — I10 Essential (primary) hypertension: Secondary | ICD-10-CM | POA: Diagnosis not present

## 2019-06-16 DIAGNOSIS — E785 Hyperlipidemia, unspecified: Secondary | ICD-10-CM | POA: Diagnosis not present

## 2019-06-18 DIAGNOSIS — M545 Low back pain: Secondary | ICD-10-CM | POA: Diagnosis not present

## 2019-06-20 DIAGNOSIS — M48061 Spinal stenosis, lumbar region without neurogenic claudication: Secondary | ICD-10-CM | POA: Diagnosis not present

## 2019-06-20 DIAGNOSIS — M5136 Other intervertebral disc degeneration, lumbar region: Secondary | ICD-10-CM | POA: Diagnosis not present

## 2019-06-20 DIAGNOSIS — G894 Chronic pain syndrome: Secondary | ICD-10-CM | POA: Diagnosis not present

## 2019-06-21 DIAGNOSIS — M48061 Spinal stenosis, lumbar region without neurogenic claudication: Secondary | ICD-10-CM | POA: Diagnosis not present

## 2019-06-21 DIAGNOSIS — M545 Low back pain: Secondary | ICD-10-CM | POA: Diagnosis not present

## 2019-06-21 DIAGNOSIS — M5136 Other intervertebral disc degeneration, lumbar region: Secondary | ICD-10-CM | POA: Diagnosis not present

## 2019-06-25 ENCOUNTER — Other Ambulatory Visit: Payer: Self-pay | Admitting: Orthopedic Surgery

## 2019-06-25 DIAGNOSIS — M545 Low back pain, unspecified: Secondary | ICD-10-CM

## 2019-06-26 DIAGNOSIS — M2560 Stiffness of unspecified joint, not elsewhere classified: Secondary | ICD-10-CM | POA: Diagnosis not present

## 2019-06-26 DIAGNOSIS — M545 Low back pain: Secondary | ICD-10-CM | POA: Diagnosis not present

## 2019-06-26 DIAGNOSIS — Z789 Other specified health status: Secondary | ICD-10-CM | POA: Diagnosis not present

## 2019-06-26 DIAGNOSIS — Z7409 Other reduced mobility: Secondary | ICD-10-CM | POA: Diagnosis not present

## 2019-06-26 DIAGNOSIS — R29898 Other symptoms and signs involving the musculoskeletal system: Secondary | ICD-10-CM | POA: Diagnosis not present

## 2019-06-26 DIAGNOSIS — R2689 Other abnormalities of gait and mobility: Secondary | ICD-10-CM | POA: Diagnosis not present

## 2019-06-27 DIAGNOSIS — R0609 Other forms of dyspnea: Secondary | ICD-10-CM | POA: Diagnosis not present

## 2019-06-27 DIAGNOSIS — R0600 Dyspnea, unspecified: Secondary | ICD-10-CM | POA: Diagnosis not present

## 2019-06-27 DIAGNOSIS — J449 Chronic obstructive pulmonary disease, unspecified: Secondary | ICD-10-CM | POA: Diagnosis not present

## 2019-07-01 ENCOUNTER — Other Ambulatory Visit: Payer: Self-pay | Admitting: Orthopedic Surgery

## 2019-07-01 DIAGNOSIS — M545 Low back pain, unspecified: Secondary | ICD-10-CM

## 2019-07-01 DIAGNOSIS — Z87891 Personal history of nicotine dependence: Secondary | ICD-10-CM | POA: Diagnosis not present

## 2019-07-01 DIAGNOSIS — J449 Chronic obstructive pulmonary disease, unspecified: Secondary | ICD-10-CM | POA: Diagnosis not present

## 2019-07-01 DIAGNOSIS — J984 Other disorders of lung: Secondary | ICD-10-CM | POA: Diagnosis not present

## 2019-07-02 ENCOUNTER — Other Ambulatory Visit: Payer: Self-pay

## 2019-07-02 ENCOUNTER — Ambulatory Visit
Admission: RE | Admit: 2019-07-02 | Discharge: 2019-07-02 | Disposition: A | Discharge status: Home / Self Care | Payer: PPO | Source: Ambulatory Visit | Attending: Orthopedic Surgery | Admitting: Orthopedic Surgery

## 2019-07-02 DIAGNOSIS — M545 Low back pain, unspecified: Secondary | ICD-10-CM

## 2019-07-02 DIAGNOSIS — M4807 Spinal stenosis, lumbosacral region: Secondary | ICD-10-CM | POA: Diagnosis not present

## 2019-07-03 DIAGNOSIS — I2510 Atherosclerotic heart disease of native coronary artery without angina pectoris: Secondary | ICD-10-CM | POA: Diagnosis not present

## 2019-07-09 DIAGNOSIS — Z23 Encounter for immunization: Secondary | ICD-10-CM | POA: Diagnosis not present

## 2019-07-09 DIAGNOSIS — I517 Cardiomegaly: Secondary | ICD-10-CM | POA: Diagnosis not present

## 2019-07-09 DIAGNOSIS — I340 Nonrheumatic mitral (valve) insufficiency: Secondary | ICD-10-CM | POA: Diagnosis not present

## 2019-07-11 DIAGNOSIS — J449 Chronic obstructive pulmonary disease, unspecified: Secondary | ICD-10-CM | POA: Diagnosis not present

## 2019-07-11 DIAGNOSIS — J984 Other disorders of lung: Secondary | ICD-10-CM | POA: Diagnosis not present

## 2019-07-16 DIAGNOSIS — Z789 Other specified health status: Secondary | ICD-10-CM | POA: Diagnosis not present

## 2019-07-16 DIAGNOSIS — M48062 Spinal stenosis, lumbar region with neurogenic claudication: Secondary | ICD-10-CM | POA: Diagnosis not present

## 2019-07-16 DIAGNOSIS — R338 Other retention of urine: Secondary | ICD-10-CM | POA: Diagnosis not present

## 2019-07-16 DIAGNOSIS — Z981 Arthrodesis status: Secondary | ICD-10-CM | POA: Diagnosis not present

## 2019-07-16 DIAGNOSIS — M5416 Radiculopathy, lumbar region: Secondary | ICD-10-CM | POA: Diagnosis not present

## 2019-07-16 DIAGNOSIS — N9989 Other postprocedural complications and disorders of genitourinary system: Secondary | ICD-10-CM | POA: Diagnosis not present

## 2019-07-16 DIAGNOSIS — Z7409 Other reduced mobility: Secondary | ICD-10-CM | POA: Diagnosis not present

## 2019-07-16 DIAGNOSIS — M5116 Intervertebral disc disorders with radiculopathy, lumbar region: Secondary | ICD-10-CM | POA: Diagnosis not present

## 2019-07-16 DIAGNOSIS — M545 Low back pain: Secondary | ICD-10-CM | POA: Diagnosis not present

## 2019-07-29 DIAGNOSIS — I2510 Atherosclerotic heart disease of native coronary artery without angina pectoris: Secondary | ICD-10-CM | POA: Diagnosis not present

## 2019-07-29 DIAGNOSIS — I252 Old myocardial infarction: Secondary | ICD-10-CM | POA: Diagnosis not present

## 2019-07-29 DIAGNOSIS — I10 Essential (primary) hypertension: Secondary | ICD-10-CM | POA: Diagnosis not present

## 2019-07-29 DIAGNOSIS — E785 Hyperlipidemia, unspecified: Secondary | ICD-10-CM | POA: Diagnosis not present

## 2019-07-29 DIAGNOSIS — J449 Chronic obstructive pulmonary disease, unspecified: Secondary | ICD-10-CM | POA: Diagnosis not present

## 2019-07-29 DIAGNOSIS — R0600 Dyspnea, unspecified: Secondary | ICD-10-CM | POA: Diagnosis not present

## 2019-07-29 DIAGNOSIS — I429 Cardiomyopathy, unspecified: Secondary | ICD-10-CM | POA: Diagnosis not present

## 2019-07-29 DIAGNOSIS — Z01810 Encounter for preprocedural cardiovascular examination: Secondary | ICD-10-CM | POA: Diagnosis not present

## 2019-07-31 DIAGNOSIS — M48062 Spinal stenosis, lumbar region with neurogenic claudication: Secondary | ICD-10-CM | POA: Diagnosis not present

## 2019-07-31 DIAGNOSIS — Z01812 Encounter for preprocedural laboratory examination: Secondary | ICD-10-CM | POA: Diagnosis not present

## 2019-07-31 DIAGNOSIS — Z20828 Contact with and (suspected) exposure to other viral communicable diseases: Secondary | ICD-10-CM | POA: Diagnosis not present

## 2019-07-31 DIAGNOSIS — M5416 Radiculopathy, lumbar region: Secondary | ICD-10-CM | POA: Diagnosis not present

## 2019-08-05 DIAGNOSIS — I255 Ischemic cardiomyopathy: Secondary | ICD-10-CM | POA: Diagnosis not present

## 2019-08-05 DIAGNOSIS — I252 Old myocardial infarction: Secondary | ICD-10-CM | POA: Diagnosis not present

## 2019-08-05 DIAGNOSIS — Z981 Arthrodesis status: Secondary | ICD-10-CM | POA: Diagnosis not present

## 2019-08-05 DIAGNOSIS — I998 Other disorder of circulatory system: Secondary | ICD-10-CM | POA: Diagnosis not present

## 2019-08-05 DIAGNOSIS — I10 Essential (primary) hypertension: Secondary | ICD-10-CM | POA: Diagnosis not present

## 2019-08-05 DIAGNOSIS — G894 Chronic pain syndrome: Secondary | ICD-10-CM | POA: Diagnosis not present

## 2019-08-05 DIAGNOSIS — Z6835 Body mass index (BMI) 35.0-35.9, adult: Secondary | ICD-10-CM | POA: Diagnosis not present

## 2019-08-05 DIAGNOSIS — E669 Obesity, unspecified: Secondary | ICD-10-CM | POA: Diagnosis not present

## 2019-08-05 DIAGNOSIS — Z7982 Long term (current) use of aspirin: Secondary | ICD-10-CM | POA: Diagnosis not present

## 2019-08-05 DIAGNOSIS — G9741 Accidental puncture or laceration of dura during a procedure: Secondary | ICD-10-CM | POA: Diagnosis not present

## 2019-08-05 DIAGNOSIS — M48062 Spinal stenosis, lumbar region with neurogenic claudication: Secondary | ICD-10-CM | POA: Diagnosis not present

## 2019-08-05 DIAGNOSIS — M2578 Osteophyte, vertebrae: Secondary | ICD-10-CM | POA: Diagnosis not present

## 2019-08-05 DIAGNOSIS — J90 Pleural effusion, not elsewhere classified: Secondary | ICD-10-CM | POA: Diagnosis not present

## 2019-08-05 DIAGNOSIS — Z452 Encounter for adjustment and management of vascular access device: Secondary | ICD-10-CM | POA: Diagnosis not present

## 2019-08-05 DIAGNOSIS — M4726 Other spondylosis with radiculopathy, lumbar region: Secondary | ICD-10-CM | POA: Diagnosis not present

## 2019-08-05 DIAGNOSIS — Z96651 Presence of right artificial knee joint: Secondary | ICD-10-CM | POA: Diagnosis not present

## 2019-08-05 DIAGNOSIS — K219 Gastro-esophageal reflux disease without esophagitis: Secondary | ICD-10-CM | POA: Diagnosis not present

## 2019-08-05 DIAGNOSIS — J449 Chronic obstructive pulmonary disease, unspecified: Secondary | ICD-10-CM | POA: Diagnosis not present

## 2019-08-05 DIAGNOSIS — Z8546 Personal history of malignant neoplasm of prostate: Secondary | ICD-10-CM | POA: Diagnosis not present

## 2019-08-05 DIAGNOSIS — I2510 Atherosclerotic heart disease of native coronary artery without angina pectoris: Secondary | ICD-10-CM | POA: Diagnosis not present

## 2019-08-05 DIAGNOSIS — G609 Hereditary and idiopathic neuropathy, unspecified: Secondary | ICD-10-CM | POA: Diagnosis not present

## 2019-08-05 DIAGNOSIS — Z87891 Personal history of nicotine dependence: Secondary | ICD-10-CM | POA: Diagnosis not present

## 2019-08-13 DIAGNOSIS — K219 Gastro-esophageal reflux disease without esophagitis: Secondary | ICD-10-CM | POA: Diagnosis not present

## 2019-08-13 DIAGNOSIS — R1310 Dysphagia, unspecified: Secondary | ICD-10-CM | POA: Diagnosis not present

## 2019-08-13 DIAGNOSIS — M4802 Spinal stenosis, cervical region: Secondary | ICD-10-CM | POA: Diagnosis not present

## 2019-08-13 DIAGNOSIS — R7303 Prediabetes: Secondary | ICD-10-CM | POA: Diagnosis not present

## 2019-08-13 DIAGNOSIS — I10 Essential (primary) hypertension: Secondary | ICD-10-CM | POA: Diagnosis not present

## 2019-08-13 DIAGNOSIS — I255 Ischemic cardiomyopathy: Secondary | ICD-10-CM | POA: Diagnosis not present

## 2019-08-13 DIAGNOSIS — E669 Obesity, unspecified: Secondary | ICD-10-CM | POA: Diagnosis not present

## 2019-08-13 DIAGNOSIS — M48062 Spinal stenosis, lumbar region with neurogenic claudication: Secondary | ICD-10-CM | POA: Diagnosis not present

## 2019-08-13 DIAGNOSIS — J984 Other disorders of lung: Secondary | ICD-10-CM | POA: Diagnosis not present

## 2019-08-13 DIAGNOSIS — G609 Hereditary and idiopathic neuropathy, unspecified: Secondary | ICD-10-CM | POA: Diagnosis not present

## 2019-08-13 DIAGNOSIS — Z4789 Encounter for other orthopedic aftercare: Secondary | ICD-10-CM | POA: Diagnosis not present

## 2019-08-13 DIAGNOSIS — M5116 Intervertebral disc disorders with radiculopathy, lumbar region: Secondary | ICD-10-CM | POA: Diagnosis not present

## 2019-08-13 DIAGNOSIS — I252 Old myocardial infarction: Secondary | ICD-10-CM | POA: Diagnosis not present

## 2019-08-13 DIAGNOSIS — K5730 Diverticulosis of large intestine without perforation or abscess without bleeding: Secondary | ICD-10-CM | POA: Diagnosis not present

## 2019-08-13 DIAGNOSIS — M5136 Other intervertebral disc degeneration, lumbar region: Secondary | ICD-10-CM | POA: Diagnosis not present

## 2019-08-13 DIAGNOSIS — I2510 Atherosclerotic heart disease of native coronary artery without angina pectoris: Secondary | ICD-10-CM | POA: Diagnosis not present

## 2019-08-13 DIAGNOSIS — Z981 Arthrodesis status: Secondary | ICD-10-CM | POA: Diagnosis not present

## 2019-08-13 DIAGNOSIS — G894 Chronic pain syndrome: Secondary | ICD-10-CM | POA: Diagnosis not present

## 2019-08-13 DIAGNOSIS — H02831 Dermatochalasis of right upper eyelid: Secondary | ICD-10-CM | POA: Diagnosis not present

## 2019-08-13 DIAGNOSIS — E7800 Pure hypercholesterolemia, unspecified: Secondary | ICD-10-CM | POA: Diagnosis not present

## 2019-08-13 DIAGNOSIS — H2513 Age-related nuclear cataract, bilateral: Secondary | ICD-10-CM | POA: Diagnosis not present

## 2019-08-13 DIAGNOSIS — H02834 Dermatochalasis of left upper eyelid: Secondary | ICD-10-CM | POA: Diagnosis not present

## 2019-08-13 DIAGNOSIS — M1712 Unilateral primary osteoarthritis, left knee: Secondary | ICD-10-CM | POA: Diagnosis not present

## 2019-08-13 DIAGNOSIS — J449 Chronic obstructive pulmonary disease, unspecified: Secondary | ICD-10-CM | POA: Diagnosis not present

## 2019-08-13 DIAGNOSIS — M4696 Unspecified inflammatory spondylopathy, lumbar region: Secondary | ICD-10-CM | POA: Diagnosis not present

## 2019-08-26 DIAGNOSIS — R1310 Dysphagia, unspecified: Secondary | ICD-10-CM | POA: Diagnosis not present

## 2019-08-26 DIAGNOSIS — M1712 Unilateral primary osteoarthritis, left knee: Secondary | ICD-10-CM | POA: Diagnosis not present

## 2019-08-26 DIAGNOSIS — M48062 Spinal stenosis, lumbar region with neurogenic claudication: Secondary | ICD-10-CM | POA: Diagnosis not present

## 2019-08-26 DIAGNOSIS — M4802 Spinal stenosis, cervical region: Secondary | ICD-10-CM | POA: Diagnosis not present

## 2019-08-26 DIAGNOSIS — K219 Gastro-esophageal reflux disease without esophagitis: Secondary | ICD-10-CM | POA: Diagnosis not present

## 2019-08-26 DIAGNOSIS — Z4789 Encounter for other orthopedic aftercare: Secondary | ICD-10-CM | POA: Diagnosis not present

## 2019-08-26 DIAGNOSIS — I252 Old myocardial infarction: Secondary | ICD-10-CM | POA: Diagnosis not present

## 2019-08-26 DIAGNOSIS — Z981 Arthrodesis status: Secondary | ICD-10-CM | POA: Diagnosis not present

## 2019-08-26 DIAGNOSIS — E669 Obesity, unspecified: Secondary | ICD-10-CM | POA: Diagnosis not present

## 2019-08-26 DIAGNOSIS — R7303 Prediabetes: Secondary | ICD-10-CM | POA: Diagnosis not present

## 2019-08-26 DIAGNOSIS — G609 Hereditary and idiopathic neuropathy, unspecified: Secondary | ICD-10-CM | POA: Diagnosis not present

## 2019-08-26 DIAGNOSIS — I2510 Atherosclerotic heart disease of native coronary artery without angina pectoris: Secondary | ICD-10-CM | POA: Diagnosis not present

## 2019-08-26 DIAGNOSIS — J449 Chronic obstructive pulmonary disease, unspecified: Secondary | ICD-10-CM | POA: Diagnosis not present

## 2019-08-26 DIAGNOSIS — K5730 Diverticulosis of large intestine without perforation or abscess without bleeding: Secondary | ICD-10-CM | POA: Diagnosis not present

## 2019-08-26 DIAGNOSIS — M5116 Intervertebral disc disorders with radiculopathy, lumbar region: Secondary | ICD-10-CM | POA: Diagnosis not present

## 2019-08-26 DIAGNOSIS — I10 Essential (primary) hypertension: Secondary | ICD-10-CM | POA: Diagnosis not present

## 2019-08-26 DIAGNOSIS — H2513 Age-related nuclear cataract, bilateral: Secondary | ICD-10-CM | POA: Diagnosis not present

## 2019-08-26 DIAGNOSIS — J984 Other disorders of lung: Secondary | ICD-10-CM | POA: Diagnosis not present

## 2019-08-26 DIAGNOSIS — I255 Ischemic cardiomyopathy: Secondary | ICD-10-CM | POA: Diagnosis not present

## 2019-08-26 DIAGNOSIS — G894 Chronic pain syndrome: Secondary | ICD-10-CM | POA: Diagnosis not present

## 2019-08-26 DIAGNOSIS — M4696 Unspecified inflammatory spondylopathy, lumbar region: Secondary | ICD-10-CM | POA: Diagnosis not present

## 2019-08-26 DIAGNOSIS — M5136 Other intervertebral disc degeneration, lumbar region: Secondary | ICD-10-CM | POA: Diagnosis not present

## 2019-08-26 DIAGNOSIS — E7800 Pure hypercholesterolemia, unspecified: Secondary | ICD-10-CM | POA: Diagnosis not present

## 2019-08-26 DIAGNOSIS — H02834 Dermatochalasis of left upper eyelid: Secondary | ICD-10-CM | POA: Diagnosis not present

## 2019-08-26 DIAGNOSIS — H02831 Dermatochalasis of right upper eyelid: Secondary | ICD-10-CM | POA: Diagnosis not present

## 2019-09-01 DIAGNOSIS — G609 Hereditary and idiopathic neuropathy, unspecified: Secondary | ICD-10-CM | POA: Diagnosis not present

## 2019-09-01 DIAGNOSIS — I255 Ischemic cardiomyopathy: Secondary | ICD-10-CM | POA: Diagnosis not present

## 2019-09-01 DIAGNOSIS — K5730 Diverticulosis of large intestine without perforation or abscess without bleeding: Secondary | ICD-10-CM | POA: Diagnosis not present

## 2019-09-01 DIAGNOSIS — I252 Old myocardial infarction: Secondary | ICD-10-CM | POA: Diagnosis not present

## 2019-09-01 DIAGNOSIS — H02831 Dermatochalasis of right upper eyelid: Secondary | ICD-10-CM | POA: Diagnosis not present

## 2019-09-01 DIAGNOSIS — M1712 Unilateral primary osteoarthritis, left knee: Secondary | ICD-10-CM | POA: Diagnosis not present

## 2019-09-01 DIAGNOSIS — Z981 Arthrodesis status: Secondary | ICD-10-CM | POA: Diagnosis not present

## 2019-09-01 DIAGNOSIS — H02834 Dermatochalasis of left upper eyelid: Secondary | ICD-10-CM | POA: Diagnosis not present

## 2019-09-01 DIAGNOSIS — Z4789 Encounter for other orthopedic aftercare: Secondary | ICD-10-CM | POA: Diagnosis not present

## 2019-09-01 DIAGNOSIS — J449 Chronic obstructive pulmonary disease, unspecified: Secondary | ICD-10-CM | POA: Diagnosis not present

## 2019-09-01 DIAGNOSIS — R1310 Dysphagia, unspecified: Secondary | ICD-10-CM | POA: Diagnosis not present

## 2019-09-01 DIAGNOSIS — M5136 Other intervertebral disc degeneration, lumbar region: Secondary | ICD-10-CM | POA: Diagnosis not present

## 2019-09-01 DIAGNOSIS — R7303 Prediabetes: Secondary | ICD-10-CM | POA: Diagnosis not present

## 2019-09-01 DIAGNOSIS — I10 Essential (primary) hypertension: Secondary | ICD-10-CM | POA: Diagnosis not present

## 2019-09-01 DIAGNOSIS — G894 Chronic pain syndrome: Secondary | ICD-10-CM | POA: Diagnosis not present

## 2019-09-01 DIAGNOSIS — M4696 Unspecified inflammatory spondylopathy, lumbar region: Secondary | ICD-10-CM | POA: Diagnosis not present

## 2019-09-01 DIAGNOSIS — J984 Other disorders of lung: Secondary | ICD-10-CM | POA: Diagnosis not present

## 2019-09-01 DIAGNOSIS — E669 Obesity, unspecified: Secondary | ICD-10-CM | POA: Diagnosis not present

## 2019-09-01 DIAGNOSIS — H2513 Age-related nuclear cataract, bilateral: Secondary | ICD-10-CM | POA: Diagnosis not present

## 2019-09-01 DIAGNOSIS — M5116 Intervertebral disc disorders with radiculopathy, lumbar region: Secondary | ICD-10-CM | POA: Diagnosis not present

## 2019-09-01 DIAGNOSIS — E7800 Pure hypercholesterolemia, unspecified: Secondary | ICD-10-CM | POA: Diagnosis not present

## 2019-09-01 DIAGNOSIS — K219 Gastro-esophageal reflux disease without esophagitis: Secondary | ICD-10-CM | POA: Diagnosis not present

## 2019-09-01 DIAGNOSIS — M4802 Spinal stenosis, cervical region: Secondary | ICD-10-CM | POA: Diagnosis not present

## 2019-09-01 DIAGNOSIS — M48062 Spinal stenosis, lumbar region with neurogenic claudication: Secondary | ICD-10-CM | POA: Diagnosis not present

## 2019-09-01 DIAGNOSIS — I2510 Atherosclerotic heart disease of native coronary artery without angina pectoris: Secondary | ICD-10-CM | POA: Diagnosis not present

## 2019-09-08 DIAGNOSIS — H43393 Other vitreous opacities, bilateral: Secondary | ICD-10-CM | POA: Diagnosis not present

## 2019-09-08 DIAGNOSIS — H02831 Dermatochalasis of right upper eyelid: Secondary | ICD-10-CM | POA: Diagnosis not present

## 2019-09-08 DIAGNOSIS — H524 Presbyopia: Secondary | ICD-10-CM | POA: Diagnosis not present

## 2019-09-08 DIAGNOSIS — H2513 Age-related nuclear cataract, bilateral: Secondary | ICD-10-CM | POA: Diagnosis not present

## 2019-09-08 DIAGNOSIS — H52203 Unspecified astigmatism, bilateral: Secondary | ICD-10-CM | POA: Diagnosis not present

## 2019-09-08 DIAGNOSIS — H25013 Cortical age-related cataract, bilateral: Secondary | ICD-10-CM | POA: Diagnosis not present

## 2019-09-08 DIAGNOSIS — H02834 Dermatochalasis of left upper eyelid: Secondary | ICD-10-CM | POA: Diagnosis not present

## 2019-09-08 DIAGNOSIS — H5213 Myopia, bilateral: Secondary | ICD-10-CM | POA: Diagnosis not present

## 2019-09-08 DIAGNOSIS — R7303 Prediabetes: Secondary | ICD-10-CM | POA: Diagnosis not present

## 2019-10-03 DIAGNOSIS — F1290 Cannabis use, unspecified, uncomplicated: Secondary | ICD-10-CM | POA: Diagnosis not present

## 2019-10-03 DIAGNOSIS — H6121 Impacted cerumen, right ear: Secondary | ICD-10-CM | POA: Diagnosis not present

## 2019-10-03 DIAGNOSIS — Z87891 Personal history of nicotine dependence: Secondary | ICD-10-CM | POA: Diagnosis not present

## 2019-10-03 DIAGNOSIS — Z7289 Other problems related to lifestyle: Secondary | ICD-10-CM | POA: Diagnosis not present

## 2019-10-23 DIAGNOSIS — M1712 Unilateral primary osteoarthritis, left knee: Secondary | ICD-10-CM | POA: Diagnosis not present

## 2019-10-23 DIAGNOSIS — Z471 Aftercare following joint replacement surgery: Secondary | ICD-10-CM | POA: Diagnosis not present

## 2019-10-23 DIAGNOSIS — Z96651 Presence of right artificial knee joint: Secondary | ICD-10-CM | POA: Diagnosis not present

## 2019-10-28 DIAGNOSIS — I7300 Raynaud's syndrome without gangrene: Secondary | ICD-10-CM | POA: Diagnosis not present

## 2019-11-27 DIAGNOSIS — Z87891 Personal history of nicotine dependence: Secondary | ICD-10-CM | POA: Diagnosis not present

## 2019-11-27 DIAGNOSIS — E785 Hyperlipidemia, unspecified: Secondary | ICD-10-CM | POA: Diagnosis not present

## 2019-11-27 DIAGNOSIS — R7303 Prediabetes: Secondary | ICD-10-CM | POA: Diagnosis not present

## 2019-11-27 DIAGNOSIS — M48062 Spinal stenosis, lumbar region with neurogenic claudication: Secondary | ICD-10-CM | POA: Diagnosis not present

## 2019-11-27 DIAGNOSIS — Z9889 Other specified postprocedural states: Secondary | ICD-10-CM | POA: Diagnosis not present

## 2019-11-27 DIAGNOSIS — I10 Essential (primary) hypertension: Secondary | ICD-10-CM | POA: Diagnosis not present

## 2019-11-27 DIAGNOSIS — G894 Chronic pain syndrome: Secondary | ICD-10-CM | POA: Diagnosis not present

## 2019-11-27 DIAGNOSIS — J449 Chronic obstructive pulmonary disease, unspecified: Secondary | ICD-10-CM | POA: Diagnosis not present

## 2019-11-27 DIAGNOSIS — I2510 Atherosclerotic heart disease of native coronary artery without angina pectoris: Secondary | ICD-10-CM | POA: Diagnosis not present

## 2019-12-29 DIAGNOSIS — M961 Postlaminectomy syndrome, not elsewhere classified: Secondary | ICD-10-CM | POA: Diagnosis not present

## 2019-12-29 DIAGNOSIS — G894 Chronic pain syndrome: Secondary | ICD-10-CM | POA: Diagnosis not present

## 2019-12-29 DIAGNOSIS — M47816 Spondylosis without myelopathy or radiculopathy, lumbar region: Secondary | ICD-10-CM | POA: Diagnosis not present

## 2020-01-06 DIAGNOSIS — M961 Postlaminectomy syndrome, not elsewhere classified: Secondary | ICD-10-CM | POA: Diagnosis not present

## 2020-01-07 DIAGNOSIS — M5416 Radiculopathy, lumbar region: Secondary | ICD-10-CM | POA: Diagnosis not present

## 2020-01-07 DIAGNOSIS — M961 Postlaminectomy syndrome, not elsewhere classified: Secondary | ICD-10-CM | POA: Diagnosis not present

## 2020-01-07 DIAGNOSIS — M47814 Spondylosis without myelopathy or radiculopathy, thoracic region: Secondary | ICD-10-CM | POA: Diagnosis not present

## 2020-01-07 DIAGNOSIS — M48062 Spinal stenosis, lumbar region with neurogenic claudication: Secondary | ICD-10-CM | POA: Diagnosis not present

## 2020-01-07 DIAGNOSIS — G894 Chronic pain syndrome: Secondary | ICD-10-CM | POA: Diagnosis not present

## 2020-01-07 DIAGNOSIS — S22029A Unspecified fracture of second thoracic vertebra, initial encounter for closed fracture: Secondary | ICD-10-CM | POA: Diagnosis not present

## 2020-02-19 DIAGNOSIS — M48062 Spinal stenosis, lumbar region with neurogenic claudication: Secondary | ICD-10-CM | POA: Diagnosis not present

## 2020-02-19 DIAGNOSIS — M961 Postlaminectomy syndrome, not elsewhere classified: Secondary | ICD-10-CM | POA: Diagnosis not present

## 2020-02-19 DIAGNOSIS — G894 Chronic pain syndrome: Secondary | ICD-10-CM | POA: Diagnosis not present

## 2020-03-09 DIAGNOSIS — H25013 Cortical age-related cataract, bilateral: Secondary | ICD-10-CM | POA: Diagnosis not present

## 2020-03-09 DIAGNOSIS — H52223 Regular astigmatism, bilateral: Secondary | ICD-10-CM | POA: Diagnosis not present

## 2020-03-09 DIAGNOSIS — H524 Presbyopia: Secondary | ICD-10-CM | POA: Diagnosis not present

## 2020-03-09 DIAGNOSIS — H2513 Age-related nuclear cataract, bilateral: Secondary | ICD-10-CM | POA: Diagnosis not present

## 2020-03-09 DIAGNOSIS — H43393 Other vitreous opacities, bilateral: Secondary | ICD-10-CM | POA: Diagnosis not present

## 2020-03-09 DIAGNOSIS — H5213 Myopia, bilateral: Secondary | ICD-10-CM | POA: Diagnosis not present

## 2020-04-01 DIAGNOSIS — F419 Anxiety disorder, unspecified: Secondary | ICD-10-CM | POA: Diagnosis not present

## 2020-04-01 DIAGNOSIS — F329 Major depressive disorder, single episode, unspecified: Secondary | ICD-10-CM | POA: Diagnosis not present

## 2020-04-01 DIAGNOSIS — R413 Other amnesia: Secondary | ICD-10-CM | POA: Diagnosis not present

## 2020-04-01 DIAGNOSIS — R4189 Other symptoms and signs involving cognitive functions and awareness: Secondary | ICD-10-CM | POA: Diagnosis not present

## 2020-04-20 DIAGNOSIS — J320 Chronic maxillary sinusitis: Secondary | ICD-10-CM | POA: Diagnosis not present

## 2020-04-20 DIAGNOSIS — I6782 Cerebral ischemia: Secondary | ICD-10-CM | POA: Diagnosis not present

## 2020-04-20 DIAGNOSIS — G319 Degenerative disease of nervous system, unspecified: Secondary | ICD-10-CM | POA: Diagnosis not present

## 2020-04-20 DIAGNOSIS — R413 Other amnesia: Secondary | ICD-10-CM | POA: Diagnosis not present

## 2020-04-20 DIAGNOSIS — J0100 Acute maxillary sinusitis, unspecified: Secondary | ICD-10-CM | POA: Diagnosis not present

## 2020-04-22 DIAGNOSIS — F329 Major depressive disorder, single episode, unspecified: Secondary | ICD-10-CM | POA: Diagnosis not present

## 2020-04-22 DIAGNOSIS — F330 Major depressive disorder, recurrent, mild: Secondary | ICD-10-CM | POA: Diagnosis not present

## 2020-04-22 DIAGNOSIS — F419 Anxiety disorder, unspecified: Secondary | ICD-10-CM | POA: Diagnosis not present

## 2020-04-22 DIAGNOSIS — R454 Irritability and anger: Secondary | ICD-10-CM | POA: Diagnosis not present

## 2020-05-24 DIAGNOSIS — Z79899 Other long term (current) drug therapy: Secondary | ICD-10-CM | POA: Diagnosis not present

## 2020-05-24 DIAGNOSIS — R454 Irritability and anger: Secondary | ICD-10-CM | POA: Diagnosis not present

## 2020-05-24 DIAGNOSIS — F330 Major depressive disorder, recurrent, mild: Secondary | ICD-10-CM | POA: Diagnosis not present

## 2020-05-27 DIAGNOSIS — L820 Inflamed seborrheic keratosis: Secondary | ICD-10-CM | POA: Diagnosis not present

## 2020-05-27 DIAGNOSIS — D692 Other nonthrombocytopenic purpura: Secondary | ICD-10-CM | POA: Diagnosis not present

## 2020-05-27 DIAGNOSIS — L814 Other melanin hyperpigmentation: Secondary | ICD-10-CM | POA: Diagnosis not present

## 2020-05-27 DIAGNOSIS — L218 Other seborrheic dermatitis: Secondary | ICD-10-CM | POA: Diagnosis not present

## 2020-05-27 DIAGNOSIS — X32XXXS Exposure to sunlight, sequela: Secondary | ICD-10-CM | POA: Diagnosis not present

## 2020-05-27 DIAGNOSIS — L821 Other seborrheic keratosis: Secondary | ICD-10-CM | POA: Diagnosis not present

## 2020-05-27 DIAGNOSIS — Z85828 Personal history of other malignant neoplasm of skin: Secondary | ICD-10-CM | POA: Diagnosis not present

## 2020-06-01 DIAGNOSIS — I255 Ischemic cardiomyopathy: Secondary | ICD-10-CM | POA: Diagnosis not present

## 2020-06-01 DIAGNOSIS — Z8546 Personal history of malignant neoplasm of prostate: Secondary | ICD-10-CM | POA: Diagnosis not present

## 2020-06-01 DIAGNOSIS — E559 Vitamin D deficiency, unspecified: Secondary | ICD-10-CM | POA: Diagnosis not present

## 2020-06-01 DIAGNOSIS — I2510 Atherosclerotic heart disease of native coronary artery without angina pectoris: Secondary | ICD-10-CM | POA: Diagnosis not present

## 2020-06-01 DIAGNOSIS — Z23 Encounter for immunization: Secondary | ICD-10-CM | POA: Diagnosis not present

## 2020-06-01 DIAGNOSIS — I10 Essential (primary) hypertension: Secondary | ICD-10-CM | POA: Diagnosis not present

## 2020-06-01 DIAGNOSIS — R7303 Prediabetes: Secondary | ICD-10-CM | POA: Diagnosis not present

## 2020-06-01 DIAGNOSIS — R0600 Dyspnea, unspecified: Secondary | ICD-10-CM | POA: Diagnosis not present

## 2020-06-01 DIAGNOSIS — Z87891 Personal history of nicotine dependence: Secondary | ICD-10-CM | POA: Diagnosis not present

## 2020-06-01 DIAGNOSIS — E785 Hyperlipidemia, unspecified: Secondary | ICD-10-CM | POA: Diagnosis not present

## 2020-06-01 DIAGNOSIS — J449 Chronic obstructive pulmonary disease, unspecified: Secondary | ICD-10-CM | POA: Diagnosis not present

## 2020-06-23 DIAGNOSIS — H6121 Impacted cerumen, right ear: Secondary | ICD-10-CM | POA: Diagnosis not present

## 2020-06-23 DIAGNOSIS — R0600 Dyspnea, unspecified: Secondary | ICD-10-CM | POA: Diagnosis not present

## 2020-06-23 DIAGNOSIS — H6123 Impacted cerumen, bilateral: Secondary | ICD-10-CM | POA: Diagnosis not present

## 2020-06-23 DIAGNOSIS — H6122 Impacted cerumen, left ear: Secondary | ICD-10-CM | POA: Diagnosis not present

## 2020-06-29 DIAGNOSIS — R419 Unspecified symptoms and signs involving cognitive functions and awareness: Secondary | ICD-10-CM | POA: Diagnosis not present

## 2020-06-30 DIAGNOSIS — J449 Chronic obstructive pulmonary disease, unspecified: Secondary | ICD-10-CM | POA: Diagnosis not present

## 2020-06-30 DIAGNOSIS — I447 Left bundle-branch block, unspecified: Secondary | ICD-10-CM | POA: Diagnosis not present

## 2020-06-30 DIAGNOSIS — I110 Hypertensive heart disease with heart failure: Secondary | ICD-10-CM | POA: Diagnosis not present

## 2020-06-30 DIAGNOSIS — M5136 Other intervertebral disc degeneration, lumbar region: Secondary | ICD-10-CM | POA: Diagnosis not present

## 2020-06-30 DIAGNOSIS — Z79899 Other long term (current) drug therapy: Secondary | ICD-10-CM | POA: Diagnosis not present

## 2020-06-30 DIAGNOSIS — I2510 Atherosclerotic heart disease of native coronary artery without angina pectoris: Secondary | ICD-10-CM | POA: Diagnosis not present

## 2020-06-30 DIAGNOSIS — R001 Bradycardia, unspecified: Secondary | ICD-10-CM | POA: Diagnosis not present

## 2020-06-30 DIAGNOSIS — I440 Atrioventricular block, first degree: Secondary | ICD-10-CM | POA: Diagnosis not present

## 2020-06-30 DIAGNOSIS — I5082 Biventricular heart failure: Secondary | ICD-10-CM | POA: Diagnosis not present

## 2020-06-30 DIAGNOSIS — I428 Other cardiomyopathies: Secondary | ICD-10-CM | POA: Diagnosis not present

## 2020-06-30 DIAGNOSIS — Z955 Presence of coronary angioplasty implant and graft: Secondary | ICD-10-CM | POA: Diagnosis not present

## 2020-06-30 DIAGNOSIS — Z87891 Personal history of nicotine dependence: Secondary | ICD-10-CM | POA: Diagnosis not present

## 2020-07-28 DIAGNOSIS — R0600 Dyspnea, unspecified: Secondary | ICD-10-CM | POA: Diagnosis not present

## 2020-07-29 DIAGNOSIS — I440 Atrioventricular block, first degree: Secondary | ICD-10-CM | POA: Diagnosis not present

## 2020-07-29 DIAGNOSIS — I447 Left bundle-branch block, unspecified: Secondary | ICD-10-CM | POA: Diagnosis not present

## 2020-08-18 DIAGNOSIS — I5030 Unspecified diastolic (congestive) heart failure: Secondary | ICD-10-CM | POA: Diagnosis not present

## 2020-08-18 DIAGNOSIS — I517 Cardiomegaly: Secondary | ICD-10-CM | POA: Diagnosis not present

## 2020-08-26 DIAGNOSIS — I454 Nonspecific intraventricular block: Secondary | ICD-10-CM | POA: Diagnosis not present

## 2020-08-26 DIAGNOSIS — I440 Atrioventricular block, first degree: Secondary | ICD-10-CM | POA: Diagnosis not present

## 2020-08-26 DIAGNOSIS — R0600 Dyspnea, unspecified: Secondary | ICD-10-CM | POA: Diagnosis not present

## 2020-09-01 DIAGNOSIS — I2510 Atherosclerotic heart disease of native coronary artery without angina pectoris: Secondary | ICD-10-CM | POA: Diagnosis not present

## 2020-09-01 DIAGNOSIS — R0600 Dyspnea, unspecified: Secondary | ICD-10-CM | POA: Diagnosis not present

## 2020-09-01 DIAGNOSIS — J449 Chronic obstructive pulmonary disease, unspecified: Secondary | ICD-10-CM | POA: Diagnosis not present

## 2020-09-01 DIAGNOSIS — N1831 Chronic kidney disease, stage 3a: Secondary | ICD-10-CM | POA: Diagnosis not present

## 2020-09-01 DIAGNOSIS — D691 Qualitative platelet defects: Secondary | ICD-10-CM | POA: Diagnosis not present

## 2020-09-01 DIAGNOSIS — Z87891 Personal history of nicotine dependence: Secondary | ICD-10-CM | POA: Diagnosis not present

## 2020-09-01 DIAGNOSIS — I129 Hypertensive chronic kidney disease with stage 1 through stage 4 chronic kidney disease, or unspecified chronic kidney disease: Secondary | ICD-10-CM | POA: Diagnosis not present

## 2020-09-01 DIAGNOSIS — R7303 Prediabetes: Secondary | ICD-10-CM | POA: Diagnosis not present

## 2020-09-01 DIAGNOSIS — F330 Major depressive disorder, recurrent, mild: Secondary | ICD-10-CM | POA: Diagnosis not present

## 2020-09-01 DIAGNOSIS — E785 Hyperlipidemia, unspecified: Secondary | ICD-10-CM | POA: Diagnosis not present

## 2020-09-01 DIAGNOSIS — I255 Ischemic cardiomyopathy: Secondary | ICD-10-CM | POA: Diagnosis not present

## 2020-09-01 DIAGNOSIS — R7989 Other specified abnormal findings of blood chemistry: Secondary | ICD-10-CM | POA: Diagnosis not present

## 2020-09-09 DIAGNOSIS — H25013 Cortical age-related cataract, bilateral: Secondary | ICD-10-CM | POA: Diagnosis not present

## 2020-09-09 DIAGNOSIS — H5213 Myopia, bilateral: Secondary | ICD-10-CM | POA: Diagnosis not present

## 2020-09-09 DIAGNOSIS — H2513 Age-related nuclear cataract, bilateral: Secondary | ICD-10-CM | POA: Diagnosis not present

## 2020-09-09 DIAGNOSIS — H52203 Unspecified astigmatism, bilateral: Secondary | ICD-10-CM | POA: Diagnosis not present

## 2020-09-09 DIAGNOSIS — R7303 Prediabetes: Secondary | ICD-10-CM | POA: Diagnosis not present

## 2020-09-09 DIAGNOSIS — H43393 Other vitreous opacities, bilateral: Secondary | ICD-10-CM | POA: Diagnosis not present

## 2020-09-09 DIAGNOSIS — H02834 Dermatochalasis of left upper eyelid: Secondary | ICD-10-CM | POA: Diagnosis not present

## 2020-09-09 DIAGNOSIS — H524 Presbyopia: Secondary | ICD-10-CM | POA: Diagnosis not present

## 2020-09-09 DIAGNOSIS — H02831 Dermatochalasis of right upper eyelid: Secondary | ICD-10-CM | POA: Diagnosis not present

## 2020-09-13 DIAGNOSIS — I10 Essential (primary) hypertension: Secondary | ICD-10-CM | POA: Diagnosis not present

## 2020-09-13 DIAGNOSIS — I452 Bifascicular block: Secondary | ICD-10-CM | POA: Diagnosis not present

## 2020-09-13 DIAGNOSIS — I447 Left bundle-branch block, unspecified: Secondary | ICD-10-CM | POA: Diagnosis not present

## 2020-09-13 DIAGNOSIS — I255 Ischemic cardiomyopathy: Secondary | ICD-10-CM | POA: Diagnosis not present

## 2020-09-23 DIAGNOSIS — R0600 Dyspnea, unspecified: Secondary | ICD-10-CM | POA: Diagnosis not present

## 2020-09-23 DIAGNOSIS — J449 Chronic obstructive pulmonary disease, unspecified: Secondary | ICD-10-CM | POA: Diagnosis not present

## 2020-09-23 DIAGNOSIS — J984 Other disorders of lung: Secondary | ICD-10-CM | POA: Diagnosis not present

## 2020-09-23 DIAGNOSIS — I255 Ischemic cardiomyopathy: Secondary | ICD-10-CM | POA: Diagnosis not present

## 2020-09-23 DIAGNOSIS — R7989 Other specified abnormal findings of blood chemistry: Secondary | ICD-10-CM | POA: Diagnosis not present

## 2020-10-14 DIAGNOSIS — I255 Ischemic cardiomyopathy: Secondary | ICD-10-CM | POA: Diagnosis not present

## 2020-10-14 DIAGNOSIS — Z01812 Encounter for preprocedural laboratory examination: Secondary | ICD-10-CM | POA: Diagnosis not present

## 2020-10-20 DIAGNOSIS — I2510 Atherosclerotic heart disease of native coronary artery without angina pectoris: Secondary | ICD-10-CM | POA: Diagnosis not present

## 2020-10-20 DIAGNOSIS — I5022 Chronic systolic (congestive) heart failure: Secondary | ICD-10-CM | POA: Diagnosis not present

## 2020-10-20 DIAGNOSIS — J449 Chronic obstructive pulmonary disease, unspecified: Secondary | ICD-10-CM | POA: Diagnosis not present

## 2020-10-20 DIAGNOSIS — Z8679 Personal history of other diseases of the circulatory system: Secondary | ICD-10-CM | POA: Diagnosis not present

## 2020-10-20 DIAGNOSIS — Z95810 Presence of automatic (implantable) cardiac defibrillator: Secondary | ICD-10-CM | POA: Diagnosis not present

## 2020-10-20 DIAGNOSIS — I10 Essential (primary) hypertension: Secondary | ICD-10-CM | POA: Diagnosis not present

## 2020-10-20 DIAGNOSIS — Z87891 Personal history of nicotine dependence: Secondary | ICD-10-CM | POA: Diagnosis not present

## 2020-10-20 DIAGNOSIS — E669 Obesity, unspecified: Secondary | ICD-10-CM | POA: Diagnosis not present

## 2020-10-20 DIAGNOSIS — I442 Atrioventricular block, complete: Secondary | ICD-10-CM | POA: Diagnosis not present

## 2020-10-20 DIAGNOSIS — I255 Ischemic cardiomyopathy: Secondary | ICD-10-CM | POA: Diagnosis not present

## 2020-10-20 DIAGNOSIS — Z955 Presence of coronary angioplasty implant and graft: Secondary | ICD-10-CM | POA: Diagnosis not present

## 2020-10-20 DIAGNOSIS — I447 Left bundle-branch block, unspecified: Secondary | ICD-10-CM | POA: Diagnosis not present

## 2020-10-20 DIAGNOSIS — Z6835 Body mass index (BMI) 35.0-35.9, adult: Secondary | ICD-10-CM | POA: Diagnosis not present

## 2020-10-21 DIAGNOSIS — M47814 Spondylosis without myelopathy or radiculopathy, thoracic region: Secondary | ICD-10-CM | POA: Diagnosis not present

## 2020-10-21 DIAGNOSIS — Z95810 Presence of automatic (implantable) cardiac defibrillator: Secondary | ICD-10-CM | POA: Diagnosis not present

## 2020-11-01 DIAGNOSIS — I252 Old myocardial infarction: Secondary | ICD-10-CM | POA: Diagnosis not present

## 2020-11-01 DIAGNOSIS — I255 Ischemic cardiomyopathy: Secondary | ICD-10-CM | POA: Diagnosis not present

## 2020-11-01 DIAGNOSIS — Z95810 Presence of automatic (implantable) cardiac defibrillator: Secondary | ICD-10-CM | POA: Diagnosis not present

## 2020-11-01 DIAGNOSIS — I50814 Right heart failure due to left heart failure: Secondary | ICD-10-CM | POA: Diagnosis not present

## 2020-12-01 DIAGNOSIS — I2510 Atherosclerotic heart disease of native coronary artery without angina pectoris: Secondary | ICD-10-CM | POA: Diagnosis not present

## 2020-12-01 DIAGNOSIS — E349 Endocrine disorder, unspecified: Secondary | ICD-10-CM | POA: Diagnosis not present

## 2020-12-01 DIAGNOSIS — E559 Vitamin D deficiency, unspecified: Secondary | ICD-10-CM | POA: Diagnosis not present

## 2020-12-01 DIAGNOSIS — J449 Chronic obstructive pulmonary disease, unspecified: Secondary | ICD-10-CM | POA: Diagnosis not present

## 2020-12-01 DIAGNOSIS — M4802 Spinal stenosis, cervical region: Secondary | ICD-10-CM | POA: Diagnosis not present

## 2020-12-01 DIAGNOSIS — I10 Essential (primary) hypertension: Secondary | ICD-10-CM | POA: Diagnosis not present

## 2020-12-01 DIAGNOSIS — E538 Deficiency of other specified B group vitamins: Secondary | ICD-10-CM | POA: Diagnosis not present

## 2020-12-01 DIAGNOSIS — G894 Chronic pain syndrome: Secondary | ICD-10-CM | POA: Diagnosis not present

## 2020-12-01 DIAGNOSIS — E785 Hyperlipidemia, unspecified: Secondary | ICD-10-CM | POA: Diagnosis not present

## 2020-12-01 DIAGNOSIS — R7303 Prediabetes: Secondary | ICD-10-CM | POA: Diagnosis not present

## 2020-12-30 DIAGNOSIS — I255 Ischemic cardiomyopathy: Secondary | ICD-10-CM | POA: Diagnosis not present

## 2021-01-25 DIAGNOSIS — E349 Endocrine disorder, unspecified: Secondary | ICD-10-CM | POA: Diagnosis not present

## 2021-01-25 DIAGNOSIS — N2581 Secondary hyperparathyroidism of renal origin: Secondary | ICD-10-CM | POA: Diagnosis not present

## 2021-01-25 DIAGNOSIS — I255 Ischemic cardiomyopathy: Secondary | ICD-10-CM | POA: Diagnosis not present

## 2021-02-28 DIAGNOSIS — I255 Ischemic cardiomyopathy: Secondary | ICD-10-CM | POA: Diagnosis not present

## 2021-02-28 DIAGNOSIS — Z4502 Encounter for adjustment and management of automatic implantable cardiac defibrillator: Secondary | ICD-10-CM | POA: Diagnosis not present

## 2021-03-10 DIAGNOSIS — H02831 Dermatochalasis of right upper eyelid: Secondary | ICD-10-CM | POA: Diagnosis not present

## 2021-03-10 DIAGNOSIS — H52223 Regular astigmatism, bilateral: Secondary | ICD-10-CM | POA: Diagnosis not present

## 2021-03-10 DIAGNOSIS — H2513 Age-related nuclear cataract, bilateral: Secondary | ICD-10-CM | POA: Diagnosis not present

## 2021-03-10 DIAGNOSIS — H524 Presbyopia: Secondary | ICD-10-CM | POA: Diagnosis not present

## 2021-03-10 DIAGNOSIS — H25013 Cortical age-related cataract, bilateral: Secondary | ICD-10-CM | POA: Diagnosis not present

## 2021-03-10 DIAGNOSIS — R7303 Prediabetes: Secondary | ICD-10-CM | POA: Diagnosis not present

## 2021-03-10 DIAGNOSIS — H43393 Other vitreous opacities, bilateral: Secondary | ICD-10-CM | POA: Diagnosis not present

## 2021-03-10 DIAGNOSIS — H02834 Dermatochalasis of left upper eyelid: Secondary | ICD-10-CM | POA: Diagnosis not present

## 2021-03-10 DIAGNOSIS — H5213 Myopia, bilateral: Secondary | ICD-10-CM | POA: Diagnosis not present

## 2021-03-14 DIAGNOSIS — I255 Ischemic cardiomyopathy: Secondary | ICD-10-CM | POA: Diagnosis not present

## 2021-03-14 DIAGNOSIS — Z95810 Presence of automatic (implantable) cardiac defibrillator: Secondary | ICD-10-CM | POA: Diagnosis not present

## 2021-03-15 DIAGNOSIS — Z950 Presence of cardiac pacemaker: Secondary | ICD-10-CM | POA: Diagnosis not present

## 2021-03-15 DIAGNOSIS — I4510 Unspecified right bundle-branch block: Secondary | ICD-10-CM | POA: Diagnosis not present

## 2021-04-06 DIAGNOSIS — H524 Presbyopia: Secondary | ICD-10-CM | POA: Diagnosis not present

## 2021-04-06 DIAGNOSIS — H52203 Unspecified astigmatism, bilateral: Secondary | ICD-10-CM | POA: Diagnosis not present

## 2021-04-06 DIAGNOSIS — H25811 Combined forms of age-related cataract, right eye: Secondary | ICD-10-CM | POA: Diagnosis not present

## 2021-04-06 DIAGNOSIS — Z538 Procedure and treatment not carried out for other reasons: Secondary | ICD-10-CM | POA: Diagnosis not present

## 2021-04-06 DIAGNOSIS — H2511 Age-related nuclear cataract, right eye: Secondary | ICD-10-CM | POA: Diagnosis not present

## 2021-04-06 DIAGNOSIS — Z5309 Procedure and treatment not carried out because of other contraindication: Secondary | ICD-10-CM | POA: Diagnosis not present

## 2021-04-08 DIAGNOSIS — Z9189 Other specified personal risk factors, not elsewhere classified: Secondary | ICD-10-CM | POA: Diagnosis not present

## 2021-04-08 DIAGNOSIS — Z6839 Body mass index (BMI) 39.0-39.9, adult: Secondary | ICD-10-CM | POA: Diagnosis not present

## 2021-04-08 DIAGNOSIS — N1831 Chronic kidney disease, stage 3a: Secondary | ICD-10-CM | POA: Diagnosis not present

## 2021-04-08 DIAGNOSIS — J449 Chronic obstructive pulmonary disease, unspecified: Secondary | ICD-10-CM | POA: Diagnosis not present

## 2021-04-08 DIAGNOSIS — R7303 Prediabetes: Secondary | ICD-10-CM | POA: Diagnosis not present

## 2021-04-13 DIAGNOSIS — I447 Left bundle-branch block, unspecified: Secondary | ICD-10-CM | POA: Diagnosis not present

## 2021-04-13 DIAGNOSIS — R0600 Dyspnea, unspecified: Secondary | ICD-10-CM | POA: Diagnosis not present

## 2021-04-13 DIAGNOSIS — Z4502 Encounter for adjustment and management of automatic implantable cardiac defibrillator: Secondary | ICD-10-CM | POA: Diagnosis not present

## 2021-04-13 DIAGNOSIS — I255 Ischemic cardiomyopathy: Secondary | ICD-10-CM | POA: Diagnosis not present

## 2021-04-14 DIAGNOSIS — Z87448 Personal history of other diseases of urinary system: Secondary | ICD-10-CM | POA: Diagnosis not present

## 2021-04-14 DIAGNOSIS — N200 Calculus of kidney: Secondary | ICD-10-CM | POA: Diagnosis not present

## 2021-04-14 DIAGNOSIS — N1831 Chronic kidney disease, stage 3a: Secondary | ICD-10-CM | POA: Diagnosis not present

## 2021-04-18 DIAGNOSIS — I517 Cardiomegaly: Secondary | ICD-10-CM | POA: Diagnosis not present

## 2021-04-18 DIAGNOSIS — I255 Ischemic cardiomyopathy: Secondary | ICD-10-CM | POA: Diagnosis not present

## 2021-04-20 DIAGNOSIS — G629 Polyneuropathy, unspecified: Secondary | ICD-10-CM | POA: Diagnosis not present

## 2021-04-20 DIAGNOSIS — Z96642 Presence of left artificial hip joint: Secondary | ICD-10-CM | POA: Diagnosis not present

## 2021-04-20 DIAGNOSIS — M79605 Pain in left leg: Secondary | ICD-10-CM | POA: Diagnosis not present

## 2021-04-20 DIAGNOSIS — S72332D Displaced oblique fracture of shaft of left femur, subsequent encounter for closed fracture with routine healing: Secondary | ICD-10-CM | POA: Diagnosis not present

## 2021-04-24 IMAGING — CT CT L SPINE W/O CM
1 of 7 series · 6 of 14 positions shown, 8 images · non-contrast
Comparison: CT abdomen 12/30/2018 and MRI lumbar spine from
07/28/2004

CLINICAL DATA: Numbness and tingling down both legs. Back pain.
History of prior back surgery.

EXAM:
CT LUMBAR SPINE WITHOUT CONTRAST
TECHNIQUE: Multidetector CT imaging of the lumbar spine was performed without
intravenous contrast administration. Multiplanar CT image
reconstructions were also generated.

[Series 3: l spine soft · axial · 0.39mm/px · z∈[-323,-137]mm · 6 of 88 slices shown, 8 images]
[im 13/88  soft-tissue]
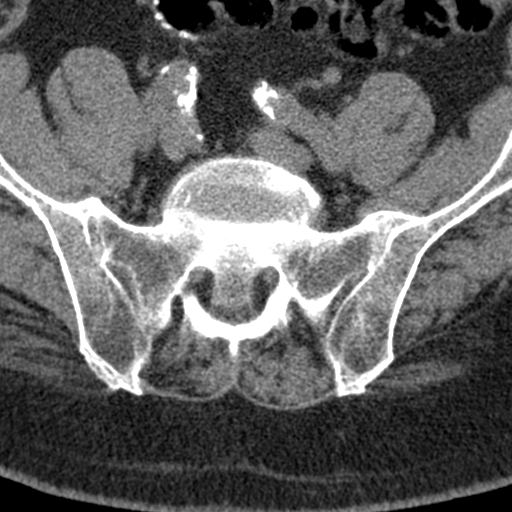
[im 13/88  bone]
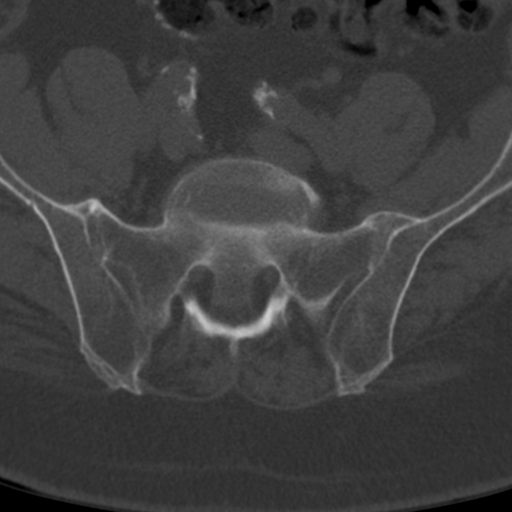
[im 25/88  bone]
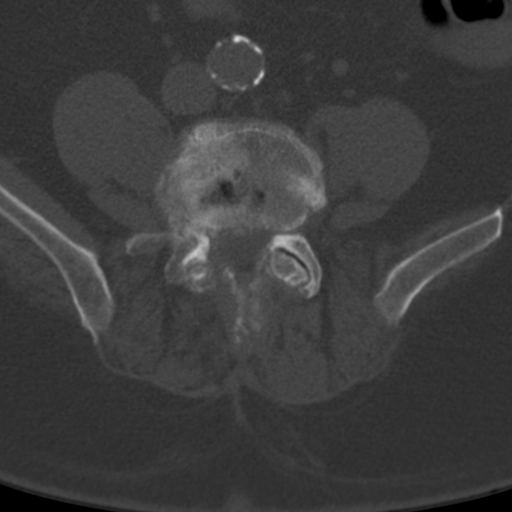
[im 38/88  bone]
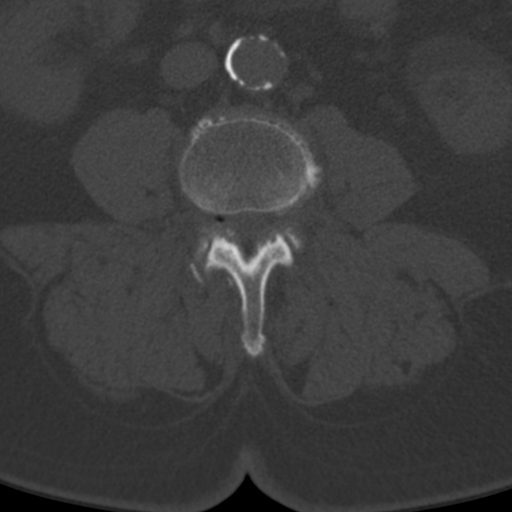
[im 50/88  bone]
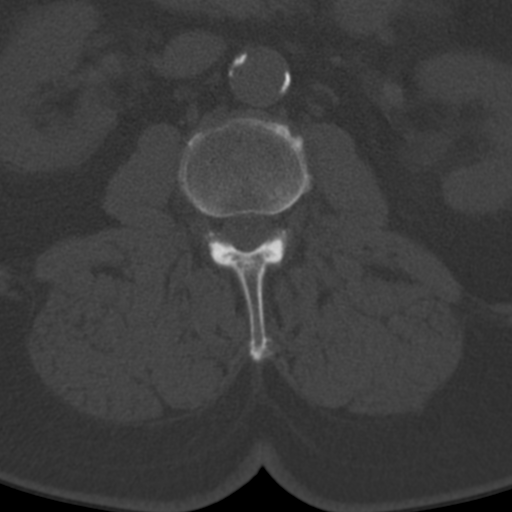
[im 63/88  soft-tissue]
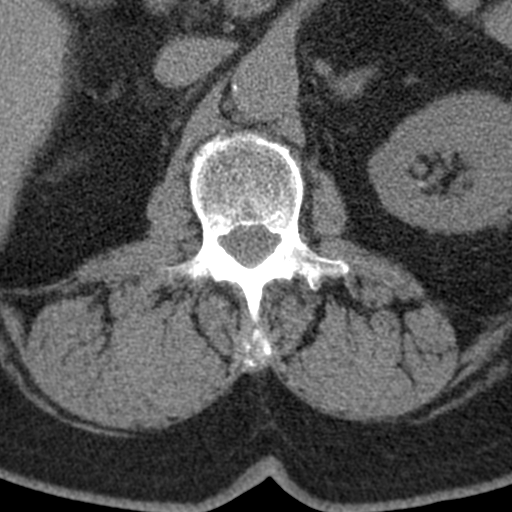
[im 63/88  bone]
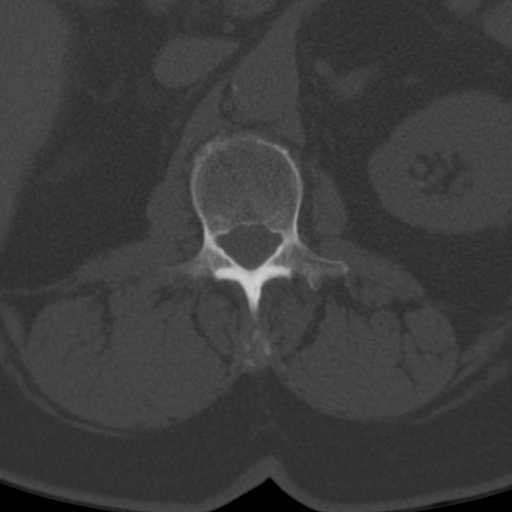
[im 75/88  bone]
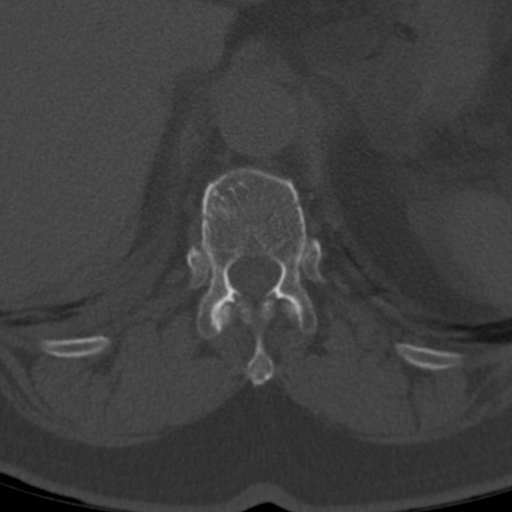

[6 of 14 positions shown; findings below may reference images not displayed]

FINDINGS: Segmentation: The lowest lumbar type non-rib-bearing vertebra is
labeled as L5.

Alignment: No vertebral subluxation is observed.

Vertebrae: Endplate sclerosis eccentric to the right with loss of
intervertebral disc height and vacuum disc phenomenon at L4-5. Faint
annular calcifications in the intervertebral discs along with
calcifications along the interspinous ligaments.

Fused sacroiliac joints.  No lumbar spine fracture.

Paraspinal and other soft tissues: Aortoiliac atherosclerotic
vascular disease. Bilateral nonobstructive nephrolithiasis.
Postoperative findings in the sigmoid colon.

Disc levels: T12-L1: Unremarkable.

L1-2: No impingement.  Diffuse disc bulge.

L2-3: Mild central narrowing of the thecal sac due to disc bulge,
facet arthropathy, and ligamentum flavum redundancy.

L3-4: Moderate central narrowing of the thecal sac and mild
bilateral foraminal stenosis potentially with bilateral subarticular
lateral recess stenosis due to disc bulge, degenerative facet
arthropathy, and ligamentum flavum redundancy. Gas along the right
neural foramen is probably in a synovial cyst which may be mildly
impinging on the right L3 nerve.

L4-5: Moderate right and mild left foraminal stenosis and suspected
right subarticular lateral recess stenosis due to intervertebral
spurring and facet spurring. Prior right laminectomy.

L5-S1: Mild bilateral foraminal stenosis due to disc bulge and facet
arthropathy.
IMPRESSION: 1. Lumbar spondylosis and degenerative disc disease, causing
moderate impingement at L3-4 and L4-5, and mild impingement at L2-3
and L5-S1, as detailed above.
2. Fused sacroiliac joints.
3. Bilateral nonobstructive nephrolithiasis.
4.  Aortic Atherosclerosis ([id number]).

## 2021-05-03 DIAGNOSIS — N200 Calculus of kidney: Secondary | ICD-10-CM | POA: Diagnosis not present

## 2021-05-03 DIAGNOSIS — N1831 Chronic kidney disease, stage 3a: Secondary | ICD-10-CM | POA: Diagnosis not present

## 2021-05-03 DIAGNOSIS — R7989 Other specified abnormal findings of blood chemistry: Secondary | ICD-10-CM | POA: Diagnosis not present

## 2021-05-03 DIAGNOSIS — I50814 Right heart failure due to left heart failure: Secondary | ICD-10-CM | POA: Diagnosis not present

## 2021-05-03 DIAGNOSIS — I255 Ischemic cardiomyopathy: Secondary | ICD-10-CM | POA: Diagnosis not present

## 2021-05-16 DIAGNOSIS — H25811 Combined forms of age-related cataract, right eye: Secondary | ICD-10-CM | POA: Diagnosis not present

## 2021-05-24 DIAGNOSIS — G4719 Other hypersomnia: Secondary | ICD-10-CM | POA: Diagnosis not present

## 2021-05-24 DIAGNOSIS — G4730 Sleep apnea, unspecified: Secondary | ICD-10-CM | POA: Diagnosis not present

## 2021-05-24 DIAGNOSIS — Z9189 Other specified personal risk factors, not elsewhere classified: Secondary | ICD-10-CM | POA: Diagnosis not present

## 2021-05-24 DIAGNOSIS — I10 Essential (primary) hypertension: Secondary | ICD-10-CM | POA: Diagnosis not present

## 2021-05-31 DIAGNOSIS — R0600 Dyspnea, unspecified: Secondary | ICD-10-CM | POA: Diagnosis not present

## 2021-05-31 DIAGNOSIS — I129 Hypertensive chronic kidney disease with stage 1 through stage 4 chronic kidney disease, or unspecified chronic kidney disease: Secondary | ICD-10-CM | POA: Diagnosis not present

## 2021-05-31 DIAGNOSIS — H59211 Accidental puncture and laceration of right eye and adnexa during an ophthalmic procedure: Secondary | ICD-10-CM | POA: Diagnosis not present

## 2021-05-31 DIAGNOSIS — Z87891 Personal history of nicotine dependence: Secondary | ICD-10-CM | POA: Diagnosis not present

## 2021-05-31 DIAGNOSIS — H2511 Age-related nuclear cataract, right eye: Secondary | ICD-10-CM | POA: Diagnosis not present

## 2021-05-31 DIAGNOSIS — I447 Left bundle-branch block, unspecified: Secondary | ICD-10-CM | POA: Diagnosis not present

## 2021-05-31 DIAGNOSIS — H25811 Combined forms of age-related cataract, right eye: Secondary | ICD-10-CM | POA: Diagnosis not present

## 2021-05-31 DIAGNOSIS — I2510 Atherosclerotic heart disease of native coronary artery without angina pectoris: Secondary | ICD-10-CM | POA: Diagnosis not present

## 2021-05-31 DIAGNOSIS — H4301 Vitreous prolapse, right eye: Secondary | ICD-10-CM | POA: Diagnosis not present

## 2021-05-31 DIAGNOSIS — J449 Chronic obstructive pulmonary disease, unspecified: Secondary | ICD-10-CM | POA: Diagnosis not present

## 2021-05-31 DIAGNOSIS — N183 Chronic kidney disease, stage 3 unspecified: Secondary | ICD-10-CM | POA: Diagnosis not present

## 2021-05-31 DIAGNOSIS — M4802 Spinal stenosis, cervical region: Secondary | ICD-10-CM | POA: Diagnosis not present

## 2021-06-01 DIAGNOSIS — Z961 Presence of intraocular lens: Secondary | ICD-10-CM | POA: Diagnosis not present

## 2021-06-01 DIAGNOSIS — Z48810 Encounter for surgical aftercare following surgery on the sense organs: Secondary | ICD-10-CM | POA: Diagnosis not present

## 2021-06-01 DIAGNOSIS — H25812 Combined forms of age-related cataract, left eye: Secondary | ICD-10-CM | POA: Diagnosis not present

## 2021-06-03 DIAGNOSIS — H59022 Cataract (lens) fragments in eye following cataract surgery, left eye: Secondary | ICD-10-CM | POA: Diagnosis not present

## 2021-06-03 DIAGNOSIS — H59021 Cataract (lens) fragments in eye following cataract surgery, right eye: Secondary | ICD-10-CM | POA: Diagnosis not present

## 2021-06-03 DIAGNOSIS — Z48810 Encounter for surgical aftercare following surgery on the sense organs: Secondary | ICD-10-CM | POA: Diagnosis not present

## 2021-06-03 DIAGNOSIS — H25812 Combined forms of age-related cataract, left eye: Secondary | ICD-10-CM | POA: Diagnosis not present

## 2021-06-03 DIAGNOSIS — Z9841 Cataract extraction status, right eye: Secondary | ICD-10-CM | POA: Diagnosis not present

## 2021-06-03 DIAGNOSIS — H2512 Age-related nuclear cataract, left eye: Secondary | ICD-10-CM | POA: Diagnosis not present

## 2021-06-08 DIAGNOSIS — H35411 Lattice degeneration of retina, right eye: Secondary | ICD-10-CM | POA: Diagnosis not present

## 2021-06-08 DIAGNOSIS — H59021 Cataract (lens) fragments in eye following cataract surgery, right eye: Secondary | ICD-10-CM | POA: Diagnosis not present

## 2021-06-08 DIAGNOSIS — H4301 Vitreous prolapse, right eye: Secondary | ICD-10-CM | POA: Diagnosis not present

## 2021-06-09 DIAGNOSIS — Z961 Presence of intraocular lens: Secondary | ICD-10-CM | POA: Diagnosis not present

## 2021-06-09 DIAGNOSIS — Z48810 Encounter for surgical aftercare following surgery on the sense organs: Secondary | ICD-10-CM | POA: Diagnosis not present

## 2021-07-05 DIAGNOSIS — Z9841 Cataract extraction status, right eye: Secondary | ICD-10-CM | POA: Diagnosis not present

## 2021-07-05 DIAGNOSIS — H59021 Cataract (lens) fragments in eye following cataract surgery, right eye: Secondary | ICD-10-CM | POA: Diagnosis not present

## 2021-08-05 DIAGNOSIS — I129 Hypertensive chronic kidney disease with stage 1 through stage 4 chronic kidney disease, or unspecified chronic kidney disease: Secondary | ICD-10-CM | POA: Diagnosis not present

## 2021-08-05 DIAGNOSIS — Z6839 Body mass index (BMI) 39.0-39.9, adult: Secondary | ICD-10-CM | POA: Diagnosis not present

## 2021-08-05 DIAGNOSIS — F330 Major depressive disorder, recurrent, mild: Secondary | ICD-10-CM | POA: Diagnosis not present

## 2021-08-05 DIAGNOSIS — N1831 Chronic kidney disease, stage 3a: Secondary | ICD-10-CM | POA: Diagnosis not present

## 2021-08-05 DIAGNOSIS — E785 Hyperlipidemia, unspecified: Secondary | ICD-10-CM | POA: Diagnosis not present

## 2021-08-05 DIAGNOSIS — I2510 Atherosclerotic heart disease of native coronary artery without angina pectoris: Secondary | ICD-10-CM | POA: Diagnosis not present

## 2021-08-05 DIAGNOSIS — I255 Ischemic cardiomyopathy: Secondary | ICD-10-CM | POA: Diagnosis not present

## 2021-08-05 DIAGNOSIS — Z8546 Personal history of malignant neoplasm of prostate: Secondary | ICD-10-CM | POA: Diagnosis not present

## 2021-08-05 DIAGNOSIS — R7303 Prediabetes: Secondary | ICD-10-CM | POA: Diagnosis not present

## 2021-08-05 DIAGNOSIS — J441 Chronic obstructive pulmonary disease with (acute) exacerbation: Secondary | ICD-10-CM | POA: Diagnosis not present

## 2021-08-05 DIAGNOSIS — J449 Chronic obstructive pulmonary disease, unspecified: Secondary | ICD-10-CM | POA: Diagnosis not present

## 2021-08-23 DIAGNOSIS — J441 Chronic obstructive pulmonary disease with (acute) exacerbation: Secondary | ICD-10-CM | POA: Diagnosis not present

## 2021-08-23 DIAGNOSIS — R059 Cough, unspecified: Secondary | ICD-10-CM | POA: Diagnosis not present

## 2021-08-23 DIAGNOSIS — H59021 Cataract (lens) fragments in eye following cataract surgery, right eye: Secondary | ICD-10-CM | POA: Diagnosis not present

## 2021-08-30 DIAGNOSIS — R230 Cyanosis: Secondary | ICD-10-CM | POA: Diagnosis not present

## 2021-08-30 DIAGNOSIS — I739 Peripheral vascular disease, unspecified: Secondary | ICD-10-CM | POA: Diagnosis not present

## 2021-09-15 DIAGNOSIS — R0602 Shortness of breath: Secondary | ICD-10-CM | POA: Diagnosis not present

## 2021-09-15 DIAGNOSIS — R0609 Other forms of dyspnea: Secondary | ICD-10-CM | POA: Diagnosis not present

## 2021-09-16 DIAGNOSIS — R0602 Shortness of breath: Secondary | ICD-10-CM | POA: Diagnosis not present

## 2023-04-25 ENCOUNTER — Encounter (INDEPENDENT_AMBULATORY_CARE_PROVIDER_SITE_OTHER): Payer: Self-pay | Admitting: Family Medicine

## 2023-04-26 ENCOUNTER — Encounter (INDEPENDENT_AMBULATORY_CARE_PROVIDER_SITE_OTHER): Payer: Self-pay | Admitting: Family Medicine
